# Patient Record
Sex: Female | Born: 1950 | Race: Black or African American | Hispanic: No | Marital: Married | Smoking: Never smoker
Health system: Southern US, Community
[De-identification: ages and names within clinical notes are randomized; demographics above are authoritative.]

## PROBLEM LIST (undated history)

## (undated) DIAGNOSIS — I10 Essential (primary) hypertension: Secondary | ICD-10-CM

## (undated) DIAGNOSIS — Z923 Personal history of irradiation: Secondary | ICD-10-CM

## (undated) DIAGNOSIS — C50919 Malignant neoplasm of unspecified site of unspecified female breast: Secondary | ICD-10-CM

## (undated) DIAGNOSIS — Z5189 Encounter for other specified aftercare: Secondary | ICD-10-CM

## (undated) DIAGNOSIS — F419 Anxiety disorder, unspecified: Secondary | ICD-10-CM

## (undated) HISTORY — DX: Encounter for other specified aftercare: Z51.89

## (undated) HISTORY — PX: TUBAL LIGATION: SHX77

## (undated) HISTORY — PX: BREAST LUMPECTOMY: SHX2

## (undated) HISTORY — DX: Malignant neoplasm of unspecified site of unspecified female breast: C50.919

## (undated) HISTORY — DX: Essential (primary) hypertension: I10

## (undated) HISTORY — PX: ABDOMINAL HYSTERECTOMY: SHX81

---

## 1998-01-12 ENCOUNTER — Other Ambulatory Visit: Admission: RE | Admit: 1998-01-12 | Discharge: 1998-01-12 | Payer: Self-pay | Admitting: Obstetrics & Gynecology

## 1998-01-19 ENCOUNTER — Ambulatory Visit (HOSPITAL_COMMUNITY): Admission: RE | Admit: 1998-01-19 | Discharge: 1998-01-19 | Payer: Self-pay | Admitting: Internal Medicine

## 1998-04-27 ENCOUNTER — Inpatient Hospital Stay (HOSPITAL_COMMUNITY): Admission: RE | Admit: 1998-04-27 | Discharge: 1998-04-30 | Payer: Self-pay | Admitting: Obstetrics & Gynecology

## 1999-01-17 ENCOUNTER — Other Ambulatory Visit: Admission: RE | Admit: 1999-01-17 | Discharge: 1999-01-17 | Payer: Self-pay | Admitting: Obstetrics & Gynecology

## 1999-01-18 ENCOUNTER — Ambulatory Visit (HOSPITAL_COMMUNITY): Admission: RE | Admit: 1999-01-18 | Discharge: 1999-01-18 | Payer: Self-pay | Admitting: Obstetrics & Gynecology

## 1999-01-18 ENCOUNTER — Encounter: Payer: Self-pay | Admitting: Obstetrics & Gynecology

## 2000-02-09 ENCOUNTER — Ambulatory Visit (HOSPITAL_COMMUNITY): Admission: RE | Admit: 2000-02-09 | Discharge: 2000-02-09 | Payer: Self-pay | Admitting: Obstetrics & Gynecology

## 2000-02-09 ENCOUNTER — Encounter: Payer: Self-pay | Admitting: Obstetrics & Gynecology

## 2000-03-19 ENCOUNTER — Other Ambulatory Visit: Admission: RE | Admit: 2000-03-19 | Discharge: 2000-03-19 | Payer: Self-pay | Admitting: Obstetrics & Gynecology

## 2001-02-28 ENCOUNTER — Encounter: Payer: Self-pay | Admitting: Obstetrics & Gynecology

## 2001-02-28 ENCOUNTER — Ambulatory Visit (HOSPITAL_COMMUNITY): Admission: RE | Admit: 2001-02-28 | Discharge: 2001-02-28 | Payer: Self-pay | Admitting: Obstetrics & Gynecology

## 2001-04-02 ENCOUNTER — Other Ambulatory Visit: Admission: RE | Admit: 2001-04-02 | Discharge: 2001-04-02 | Payer: Self-pay | Admitting: Obstetrics & Gynecology

## 2001-07-11 ENCOUNTER — Ambulatory Visit (HOSPITAL_COMMUNITY): Admission: RE | Admit: 2001-07-11 | Discharge: 2001-07-11 | Payer: Self-pay | Admitting: Gastroenterology

## 2002-05-07 ENCOUNTER — Other Ambulatory Visit: Admission: RE | Admit: 2002-05-07 | Discharge: 2002-05-07 | Payer: Self-pay | Admitting: Obstetrics & Gynecology

## 2003-06-18 ENCOUNTER — Other Ambulatory Visit: Admission: RE | Admit: 2003-06-18 | Discharge: 2003-06-18 | Payer: Self-pay | Admitting: Obstetrics & Gynecology

## 2004-10-19 ENCOUNTER — Other Ambulatory Visit: Admission: RE | Admit: 2004-10-19 | Discharge: 2004-10-19 | Payer: Self-pay | Admitting: Obstetrics & Gynecology

## 2005-11-23 ENCOUNTER — Ambulatory Visit (HOSPITAL_COMMUNITY): Admission: RE | Admit: 2005-11-23 | Discharge: 2005-11-23 | Payer: Self-pay | Admitting: Obstetrics & Gynecology

## 2006-12-31 ENCOUNTER — Ambulatory Visit (HOSPITAL_COMMUNITY): Admission: RE | Admit: 2006-12-31 | Discharge: 2006-12-31 | Payer: Self-pay | Admitting: Obstetrics & Gynecology

## 2007-07-17 ENCOUNTER — Ambulatory Visit (HOSPITAL_COMMUNITY): Admission: RE | Admit: 2007-07-17 | Discharge: 2007-07-17 | Payer: Self-pay | Admitting: Internal Medicine

## 2008-01-15 ENCOUNTER — Ambulatory Visit (HOSPITAL_COMMUNITY): Admission: RE | Admit: 2008-01-15 | Discharge: 2008-01-15 | Payer: Self-pay | Admitting: Obstetrics & Gynecology

## 2008-01-28 ENCOUNTER — Encounter: Admission: RE | Admit: 2008-01-28 | Discharge: 2008-01-28 | Payer: Self-pay | Admitting: Obstetrics & Gynecology

## 2009-01-26 ENCOUNTER — Ambulatory Visit (HOSPITAL_COMMUNITY): Admission: RE | Admit: 2009-01-26 | Discharge: 2009-01-26 | Payer: Self-pay | Admitting: Obstetrics & Gynecology

## 2009-02-08 ENCOUNTER — Encounter: Admission: RE | Admit: 2009-02-08 | Discharge: 2009-02-08 | Payer: Self-pay | Admitting: Obstetrics & Gynecology

## 2010-01-03 ENCOUNTER — Encounter: Admission: RE | Admit: 2010-01-03 | Discharge: 2010-01-03 | Payer: Self-pay | Admitting: Internal Medicine

## 2010-02-09 ENCOUNTER — Encounter
Admission: RE | Admit: 2010-02-09 | Discharge: 2010-02-09 | Payer: Self-pay | Source: Home / Self Care | Attending: Obstetrics & Gynecology | Admitting: Obstetrics & Gynecology

## 2010-02-20 ENCOUNTER — Encounter: Payer: Self-pay | Admitting: Internal Medicine

## 2010-02-20 ENCOUNTER — Encounter: Payer: Self-pay | Admitting: Obstetrics & Gynecology

## 2010-06-17 NOTE — Procedures (Signed)
[place]  Patient:    Tanya Turner, Tanya Turner Visit Number: [id number] MRN: [id number]          Service Type: END Location: ENDO Attending Physician:  Charna Elizabeth Dictated by:   Anselmo Rod, M.D. Proc. Date: 07/11/01 Admit Date:  07/11/2001 Discharge Date: 07/11/2001   CC:         Lilly Cove, M.D.   Procedure Report  DATE OF BIRTH:  [birth year].  PROCEDURE:  Screening colonoscopy.  ENDOSCOPIST:  Anselmo Rod, M.D.  INSTRUMENT USED:  Olympus video colonoscope.  INDICATION FOR PROCEDURE:  A 60 year old African-American female undergoing screening colonoscopy to rule out colonic polyps, masses, hemorrhoids, etc.  PREPROCEDURE PREPARATION:  Informed consent was procured from the patient. The patient was fasted for eight hours prior to the procedure and prepped with a bottle of magnesium citrate and a gallon of NuLytely the night prior to the procedure.  PREPROCEDURE PHYSICAL:  VITAL SIGNS:  The patient had stable vital signs.  NECK:  Supple.  CHEST:  Clear to auscultation.  S1, S2 regular.  ABDOMEN:  Soft with normal bowel sounds.  DESCRIPTION OF PROCEDURE:  The patient was placed in the left lateral decubitus position and sedated with 80 mg of Demerol and 8 mg of Versed intravenously.  Once the patient was adequately sedate and maintained on low-flow oxygen and continuous cardiac monitoring, the Olympus video colonoscope was advanced from the rectum to the cecum and terminal ileum without difficulty.  No masses, polyps, erosions, ulcerations, or diverticula were seen.  The patient tolerated the procedure well without complications.  IMPRESSION:  Normal colonoscopy up to the terminal ileum.  RECOMMENDATIONS: 1. Repeat colorectal cancer screening is recommended in the next 10 years    unless the patient develops any abnormal symptoms in the interim. 2. A high-fiber diet has been encouraged. Dictated by:   Anselmo Rod, M.D. Attending Physician:  Charna Elizabeth DD:  07/11/01 TD:  07/13/01 Job: [id number]

## 2010-10-26 ENCOUNTER — Other Ambulatory Visit: Payer: Self-pay | Admitting: Obstetrics & Gynecology

## 2010-10-26 DIAGNOSIS — Z1231 Encounter for screening mammogram for malignant neoplasm of breast: Secondary | ICD-10-CM

## 2011-02-14 ENCOUNTER — Ambulatory Visit
Admission: RE | Admit: 2011-02-14 | Discharge: 2011-02-14 | Disposition: A | Discharge status: Home / Self Care | Payer: Federal, State, Local not specified - PPO | Source: Ambulatory Visit | Attending: Obstetrics & Gynecology | Admitting: Obstetrics & Gynecology

## 2011-02-14 DIAGNOSIS — Z1231 Encounter for screening mammogram for malignant neoplasm of breast: Secondary | ICD-10-CM

## 2012-01-09 ENCOUNTER — Other Ambulatory Visit: Payer: Self-pay | Admitting: Obstetrics & Gynecology

## 2012-01-09 DIAGNOSIS — Z1231 Encounter for screening mammogram for malignant neoplasm of breast: Secondary | ICD-10-CM

## 2012-02-15 ENCOUNTER — Ambulatory Visit
Admission: RE | Admit: 2012-02-15 | Discharge: 2012-02-15 | Disposition: A | Discharge status: Home / Self Care | Payer: Federal, State, Local not specified - PPO | Source: Ambulatory Visit | Attending: Obstetrics & Gynecology | Admitting: Obstetrics & Gynecology

## 2012-02-15 DIAGNOSIS — Z1231 Encounter for screening mammogram for malignant neoplasm of breast: Secondary | ICD-10-CM

## 2013-01-20 ENCOUNTER — Other Ambulatory Visit: Payer: Self-pay

## 2013-01-20 DIAGNOSIS — Z1231 Encounter for screening mammogram for malignant neoplasm of breast: Secondary | ICD-10-CM

## 2013-02-20 ENCOUNTER — Ambulatory Visit
Admission: RE | Admit: 2013-02-20 | Discharge: 2013-02-20 | Disposition: A | Discharge status: Home / Self Care | Payer: Federal, State, Local not specified - PPO | Source: Ambulatory Visit

## 2013-02-20 DIAGNOSIS — Z1231 Encounter for screening mammogram for malignant neoplasm of breast: Secondary | ICD-10-CM

## 2013-12-31 ENCOUNTER — Other Ambulatory Visit: Payer: Self-pay

## 2013-12-31 DIAGNOSIS — Z1231 Encounter for screening mammogram for malignant neoplasm of breast: Secondary | ICD-10-CM

## 2014-02-23 ENCOUNTER — Ambulatory Visit: Payer: Federal, State, Local not specified - PPO

## 2014-03-02 ENCOUNTER — Ambulatory Visit
Admission: RE | Admit: 2014-03-02 | Discharge: 2014-03-02 | Disposition: A | Discharge status: Home / Self Care | Payer: Federal, State, Local not specified - PPO | Source: Ambulatory Visit

## 2014-03-02 DIAGNOSIS — Z1231 Encounter for screening mammogram for malignant neoplasm of breast: Secondary | ICD-10-CM

## 2015-03-08 ENCOUNTER — Other Ambulatory Visit: Payer: Self-pay

## 2015-03-08 DIAGNOSIS — Z1231 Encounter for screening mammogram for malignant neoplasm of breast: Secondary | ICD-10-CM

## 2015-04-14 ENCOUNTER — Ambulatory Visit
Admission: RE | Admit: 2015-04-14 | Discharge: 2015-04-14 | Disposition: A | Discharge status: Home / Self Care | Payer: Medicare Other | Source: Ambulatory Visit

## 2015-04-14 DIAGNOSIS — Z1231 Encounter for screening mammogram for malignant neoplasm of breast: Secondary | ICD-10-CM

## 2016-03-23 ENCOUNTER — Other Ambulatory Visit: Payer: Self-pay | Admitting: Obstetrics & Gynecology

## 2016-03-23 DIAGNOSIS — Z1231 Encounter for screening mammogram for malignant neoplasm of breast: Secondary | ICD-10-CM

## 2016-04-17 ENCOUNTER — Ambulatory Visit
Admission: RE | Admit: 2016-04-17 | Discharge: 2016-04-17 | Disposition: A | Discharge status: Home / Self Care | Payer: Medicare Other | Source: Ambulatory Visit | Attending: Obstetrics & Gynecology | Admitting: Obstetrics & Gynecology

## 2016-04-17 DIAGNOSIS — Z1231 Encounter for screening mammogram for malignant neoplasm of breast: Secondary | ICD-10-CM

## 2016-12-26 ENCOUNTER — Other Ambulatory Visit: Payer: Self-pay | Admitting: Internal Medicine

## 2016-12-26 ENCOUNTER — Ambulatory Visit
Admission: RE | Admit: 2016-12-26 | Discharge: 2016-12-26 | Disposition: A | Discharge status: Home / Self Care | Payer: Medicare Other | Source: Ambulatory Visit | Attending: Internal Medicine | Admitting: Internal Medicine

## 2016-12-26 DIAGNOSIS — R52 Pain, unspecified: Secondary | ICD-10-CM

## 2017-03-09 ENCOUNTER — Other Ambulatory Visit: Payer: Self-pay | Admitting: Obstetrics & Gynecology

## 2017-03-09 DIAGNOSIS — Z1231 Encounter for screening mammogram for malignant neoplasm of breast: Secondary | ICD-10-CM

## 2017-04-18 ENCOUNTER — Ambulatory Visit
Admission: RE | Admit: 2017-04-18 | Discharge: 2017-04-18 | Disposition: A | Discharge status: Home / Self Care | Payer: Medicare Other | Source: Ambulatory Visit | Attending: Obstetrics & Gynecology | Admitting: Obstetrics & Gynecology

## 2017-04-18 ENCOUNTER — Ambulatory Visit: Payer: Medicare Other

## 2017-04-18 DIAGNOSIS — Z1231 Encounter for screening mammogram for malignant neoplasm of breast: Secondary | ICD-10-CM

## 2017-06-05 ENCOUNTER — Encounter (INDEPENDENT_AMBULATORY_CARE_PROVIDER_SITE_OTHER): Payer: Self-pay | Admitting: Orthopedic Surgery

## 2017-06-05 ENCOUNTER — Ambulatory Visit (INDEPENDENT_AMBULATORY_CARE_PROVIDER_SITE_OTHER): Payer: Medicare Other

## 2017-06-05 ENCOUNTER — Ambulatory Visit (INDEPENDENT_AMBULATORY_CARE_PROVIDER_SITE_OTHER): Payer: Medicare Other | Admitting: Orthopedic Surgery

## 2017-06-05 VITALS — Ht 62.0 in | Wt 195.0 lb

## 2017-06-05 DIAGNOSIS — S838X2A Sprain of other specified parts of left knee, initial encounter: Secondary | ICD-10-CM

## 2017-06-05 DIAGNOSIS — G8929 Other chronic pain: Secondary | ICD-10-CM

## 2017-06-05 DIAGNOSIS — M25562 Pain in left knee: Secondary | ICD-10-CM

## 2017-06-05 MED ORDER — DIAZEPAM 5 MG PO TABS: ORAL_TABLET | ORAL | 0 refills | Status: DC

## 2017-06-05 NOTE — Progress Notes (Signed)
Office Visit Note   Patient: Tanya Turner           Date of Birth: 1950/05/29           MRN: [id number] Visit Date: 06/05/2017 Requested by: No referring provider defined for this encounter. PCP: Patient, No Pcp Per  Subjective: Chief Complaint  Patient presents with  . Left Knee - Pain    HPI: Tanya Turner is a patient with left knee pain.  She had a twisting injury going down the stairs over 4 weeks ago.  She has had pain and swelling since that time.  She reports medial sided pain with new mechanical symptoms.  She is retired.  Stairs are difficult going up and down at home.  She does describe occasional knifelike sharp pain on the medial aspect of the knee.  She takes Tylenol for her symptoms.              ROS: All systems reviewed are negative as they relate to the chief complaint within the history of present illness.  Patient denies  fevers or chills.   Assessment & Plan: Visit Diagnoses:  1. Chronic pain of left knee   2. Injury of meniscus of left knee, initial encounter     Plan: Impression is left knee effusion with medial joint line tenderness and fairly normal radiographs and a patient had a twisting injury to her left knee.  Statistically speaking this is highly likely to be a medial meniscal tear.  She has failed conservative measures.  Plan MRI left knee to evaluate for medial meniscal tear.  I will see her back after that study.  May need surgical intervention.  Orthopedic exam demonstrates full active and passive range of motion of both knees with mild effusion in the left knee.  Collateral cruciate ligaments are stable extensor mechanism is intact.  Medial joint line tenderness is present on the left-hand side.  Follow-Up Instructions: Return for after MRI.   Orders:  Orders Placed This Encounter  Procedures  . XR Knee 1-2 Views Left  . MR Knee Left w/o contrast   Meds ordered this encounter  Medications  . diazepam (VALIUM) 5 MG tablet    Sig: 1-2 po 30 min  prior to procedure.    Dispense:  2 tablet    Refill:  0      Procedures: No procedures performed   Clinical Data: No additional findings.  Objective: Vital Signs: Ht 5\' 2"  (1.575 m)   Wt 195 lb (88.5 kg)   BMI 35.67 kg/m   Physical Exam:   Constitutional: Patient appears well-developed HEENT:  Head: Normocephalic Eyes:EOM are normal Neck: Normal range of motion Cardiovascular: Normal rate Pulmonary/chest: Effort normal Neurologic: Patient is alert Skin: Skin is warm Psychiatric: Patient has normal mood and affect    Ortho Exam: McMurray compression testing positive on the medial side.  ACL PCL feel stable on the left.  Patient has normal gait and alignment.  Patient has full range of motion in the left knee.  Extensor mechanism is intact and nontender.  Collaterals are stable.  Medial joint line tenderness is present.  Pedal pulses palpable.  No groin pain with internal/external rotation of the leg.  Specialty Comments:  No specialty comments available.  Imaging: Xr Knee 1-2 Views Left  Result Date: 06/05/2017 AP lateral left knee reviewed.  Minimal degenerative change noted in the medial compartment.  No fracture or dislocation present in the left knee.  Patellar relation to the femur  normal.    PMFS History: There are no active problems to display for this patient.  History reviewed. No pertinent past medical history.  History reviewed. No pertinent family history.  History reviewed. No pertinent surgical history. Social History   Occupational History  . Not on file  Tobacco Use  . Smoking status: Unknown If Ever Smoked  . Smokeless tobacco: Never Used  Substance and Sexual Activity  . Alcohol use: Not on file  . Drug use: Not on file  . Sexual activity: Not on file

## 2017-06-19 ENCOUNTER — Ambulatory Visit
Admission: RE | Admit: 2017-06-19 | Discharge: 2017-06-19 | Disposition: A | Discharge status: Home / Self Care | Payer: Medicare Other | Source: Ambulatory Visit | Attending: Orthopedic Surgery | Admitting: Orthopedic Surgery

## 2017-06-19 DIAGNOSIS — M25562 Pain in left knee: Principal | ICD-10-CM

## 2017-06-19 DIAGNOSIS — G8929 Other chronic pain: Secondary | ICD-10-CM

## 2017-06-27 ENCOUNTER — Telehealth (INDEPENDENT_AMBULATORY_CARE_PROVIDER_SITE_OTHER): Payer: Self-pay

## 2017-06-27 ENCOUNTER — Ambulatory Visit (INDEPENDENT_AMBULATORY_CARE_PROVIDER_SITE_OTHER): Payer: Medicare Other | Admitting: Orthopedic Surgery

## 2017-06-27 ENCOUNTER — Encounter (INDEPENDENT_AMBULATORY_CARE_PROVIDER_SITE_OTHER): Payer: Self-pay | Admitting: Orthopedic Surgery

## 2017-06-27 DIAGNOSIS — M1712 Unilateral primary osteoarthritis, left knee: Secondary | ICD-10-CM

## 2017-06-27 NOTE — Telephone Encounter (Signed)
Submitted application online for SynviscOne injection, left knee.  

## 2017-07-01 ENCOUNTER — Encounter (INDEPENDENT_AMBULATORY_CARE_PROVIDER_SITE_OTHER): Payer: Self-pay | Admitting: Orthopedic Surgery

## 2017-07-01 NOTE — Progress Notes (Signed)
   Office Visit Note   Patient: Tanya Turner           Date of Birth: 01/31/1950           MRN: [id number] Visit Date: 06/27/2017 Requested by: No referring provider defined for this encounter. PCP: Patient, No Pcp Per  Subjective: Chief Complaint  Patient presents with  . Left Knee - Follow-up    HPI: Scheryl is a patient with left knee pain.  Since I seen her she has had an MRI scan which is reviewed.  It shows no meniscal tear but there is medial compartment arthritis.  Patient reports bad pain at night.  Hard for her to get comfortable to sleep.  She is able to walk.  She had an injection which helped her for 1 day which was placed 3 or 4 weeks ago.  Tylenol is not helpful.  She wants to avoid surgery if possible.              ROS: All systems reviewed are negative as they relate to the chief complaint within the history of present illness.  Patient denies  fevers or chills.   Assessment & Plan: Visit Diagnoses:  1. Unilateral primary osteoarthritis, left knee     Plan: Impression is left knee pain with medial compartment arthritis.  Plan is left knee gel injection.  We will preapproved that and likely get her injected within the month.  Follow-Up Instructions: Return in about 1 month (around 07/25/2017).   Orders:  No orders of the defined types were placed in this encounter.  No orders of the defined types were placed in this encounter.     Procedures: No procedures performed   Clinical Data: No additional findings.  Objective: Vital Signs: There were no vitals taken for this visit.  Physical Exam:   Constitutional: Patient appears well-developed HEENT:  Head: Normocephalic Eyes:EOM are normal Neck: Normal range of motion Cardiovascular: Normal rate Pulmonary/chest: Effort normal Neurologic: Patient is alert Skin: Skin is warm Psychiatric: Patient has normal mood and affect    Ortho Exam: Orthopedic exam demonstrates full active and passive range of  motion of that left knee with medial joint line tenderness stable collateral crucial ligaments intact extensor mechanism no other masses lymph adenopathy or skin changes noted in the left knee region.  Specialty Comments:  No specialty comments available.  Imaging: No results found.   PMFS History: There are no active problems to display for this patient.  History reviewed. No pertinent past medical history.  History reviewed. No pertinent family history.  History reviewed. No pertinent surgical history. Social History   Occupational History  . Not on file  Tobacco Use  . Smoking status: Unknown If Ever Smoked  . Smokeless tobacco: Never Used  Substance and Sexual Activity  . Alcohol use: Not on file  . Drug use: Not on file  . Sexual activity: Not on file

## 2017-07-10 ENCOUNTER — Telehealth (INDEPENDENT_AMBULATORY_CARE_PROVIDER_SITE_OTHER): Payer: Self-pay

## 2017-07-10 NOTE — Telephone Encounter (Signed)
Talked with patient and advised her that she is approved for SynviscOne injection, left knee. Covered at 100% through [facility] after Medicare. Buy& Bill No PA required No Co-pay Appt. Scheduled 07/13/17-Dr. Marlou Sa

## 2017-07-10 NOTE — Telephone Encounter (Signed)
Patient called to check on approval of synvisc injections. Please call once ready to schedule # [phone]

## 2017-07-10 NOTE — Telephone Encounter (Signed)
Talked with patient concerning approval.  Appt.scheduled.

## 2017-07-13 ENCOUNTER — Ambulatory Visit (INDEPENDENT_AMBULATORY_CARE_PROVIDER_SITE_OTHER): Payer: Medicare Other | Admitting: Orthopedic Surgery

## 2017-07-13 ENCOUNTER — Encounter (INDEPENDENT_AMBULATORY_CARE_PROVIDER_SITE_OTHER): Payer: Self-pay | Admitting: Orthopedic Surgery

## 2017-07-13 DIAGNOSIS — M1712 Unilateral primary osteoarthritis, left knee: Secondary | ICD-10-CM

## 2017-07-14 ENCOUNTER — Encounter (INDEPENDENT_AMBULATORY_CARE_PROVIDER_SITE_OTHER): Payer: Self-pay | Admitting: Orthopedic Surgery

## 2017-07-14 DIAGNOSIS — M1712 Unilateral primary osteoarthritis, left knee: Secondary | ICD-10-CM | POA: Diagnosis not present

## 2017-07-14 MED ORDER — HYLAN G-F 20 48 MG/6ML IX SOSY
48.0000 mg | PREFILLED_SYRINGE | INTRA_ARTICULAR | Status: AC | PRN
  Administered 2017-07-14: 48 mg via INTRA_ARTICULAR

## 2017-07-14 MED ORDER — LIDOCAINE HCL 1 % IJ SOLN
5.0000 mL | INTRAMUSCULAR | Status: AC | PRN
  Administered 2017-07-14: 5 mL

## 2017-07-14 NOTE — Progress Notes (Signed)
   Procedure Note  Patient: JOLEE CRITCHER             Date of Birth: 17-Mar-1950           MRN: [id number]             Visit Date: 07/13/2017  Procedures: Visit Diagnoses: Unilateral primary osteoarthritis, left knee  Large Joint Inj: L knee on 07/14/2017 8:13 AM Indications: pain, joint swelling and diagnostic evaluation Details: 18 G 1.5 in needle, superolateral approach  Arthrogram: No  Medications: 5 mL lidocaine 1 %; 48 mg Hylan 48 MG/6ML Outcome: tolerated well, no immediate complications Procedure, treatment alternatives, risks and benefits explained, specific risks discussed. Consent was given by the patient. Immediately prior to procedure a time out was called to verify the correct patient, procedure, equipment, support staff and site/side marked as required. Patient was prepped and draped in the usual sterile fashion.

## 2018-03-20 ENCOUNTER — Other Ambulatory Visit: Payer: Self-pay | Admitting: Obstetrics & Gynecology

## 2018-03-20 DIAGNOSIS — Z1231 Encounter for screening mammogram for malignant neoplasm of breast: Secondary | ICD-10-CM

## 2018-04-22 ENCOUNTER — Ambulatory Visit: Payer: Medicare Other

## 2018-05-30 ENCOUNTER — Ambulatory Visit: Payer: Medicare Other

## 2018-07-09 ENCOUNTER — Ambulatory Visit: Payer: Medicare Other

## 2018-07-12 ENCOUNTER — Ambulatory Visit: Payer: Medicare Other

## 2018-09-03 ENCOUNTER — Other Ambulatory Visit: Payer: Self-pay

## 2018-09-03 ENCOUNTER — Ambulatory Visit
Admission: RE | Admit: 2018-09-03 | Discharge: 2018-09-03 | Disposition: A | Discharge status: Home / Self Care | Payer: Medicare Other | Source: Ambulatory Visit | Attending: Obstetrics & Gynecology | Admitting: Obstetrics & Gynecology

## 2018-09-03 DIAGNOSIS — Z1231 Encounter for screening mammogram for malignant neoplasm of breast: Secondary | ICD-10-CM

## 2019-03-20 ENCOUNTER — Ambulatory Visit: Payer: Medicare Other

## 2019-03-24 ENCOUNTER — Ambulatory Visit: Payer: Medicare Other | Attending: Internal Medicine

## 2019-03-24 DIAGNOSIS — Z23 Encounter for immunization: Secondary | ICD-10-CM | POA: Insufficient documentation

## 2019-03-24 NOTE — Progress Notes (Signed)
   Covid-19 Vaccination Clinic  Name:  Tanya Turner    MRN: [id number] DOB: 02-Apr-1950  03/24/2019  Ms. Tanya Turner was observed post Covid-19 immunization for 15 minutes without incidence. She was provided with Vaccine Information Sheet and instruction to access the V-Safe system.   [name] was instructed to call 911 with any severe reactions post vaccine: Marland Kitchen Difficulty breathing  . Swelling of your face and throat  . A fast heartbeat  . A bad rash all over your body  . Dizziness and weakness    Immunizations Administered    Name Date Dose VIS Date Route   Moderna COVID-19 Vaccine 03/24/2019 10:07 AM 0.5 mL 12/31/2018 Intramuscular   Manufacturer: Moderna   Lot: [id number]   [place]:[id number]

## 2019-04-22 ENCOUNTER — Ambulatory Visit: Payer: Medicare Other | Attending: Family

## 2019-04-22 DIAGNOSIS — Z23 Encounter for immunization: Secondary | ICD-10-CM

## 2019-04-22 NOTE — Progress Notes (Signed)
   Covid-19 Vaccination Clinic  Name:  Tanya Turner    MRN: [id number] DOB: October 19, 1950  04/22/2019  Tanya Turner was observed post Covid-19 immunization for 15 minutes without incident. She was provided with Vaccine Information Sheet and instruction to access the V-Safe system.   Tanya Turner was instructed to call 911 with any severe reactions post vaccine: Marland Kitchen Difficulty breathing  . Swelling of face and throat  . A fast heartbeat  . A bad rash all over body  . Dizziness and weakness   Immunizations Administered    Name Date Dose VIS Date Route   Moderna COVID-19 Vaccine 04/22/2019 11:45 AM 0.5 mL 12/31/2018 Intramuscular   Manufacturer: Moderna   LotMV:[id number]   [id number]

## 2019-09-30 ENCOUNTER — Other Ambulatory Visit: Payer: Self-pay | Admitting: Obstetrics & Gynecology

## 2019-09-30 DIAGNOSIS — Z1231 Encounter for screening mammogram for malignant neoplasm of breast: Secondary | ICD-10-CM

## 2019-10-14 ENCOUNTER — Other Ambulatory Visit: Payer: Self-pay

## 2019-10-14 ENCOUNTER — Ambulatory Visit
Admission: RE | Admit: 2019-10-14 | Discharge: 2019-10-14 | Disposition: A | Discharge status: Home / Self Care | Payer: Medicare Other | Source: Ambulatory Visit

## 2019-10-14 DIAGNOSIS — Z1231 Encounter for screening mammogram for malignant neoplasm of breast: Secondary | ICD-10-CM

## 2019-12-02 ENCOUNTER — Ambulatory Visit: Payer: Medicare Other | Attending: Internal Medicine

## 2019-12-02 DIAGNOSIS — Z23 Encounter for immunization: Secondary | ICD-10-CM

## 2019-12-02 NOTE — Progress Notes (Signed)
   Covid-19 Vaccination Clinic  Name:  Tanya Turner    MRN: [id number] DOB: 1950-09-12  12/02/2019  Tanya Turner was observed post Covid-19 immunization for 15 minutes without incident. She was provided with Vaccine Information Sheet and instruction to access the V-Safe system.   Tanya Turner was instructed to call 911 with any severe reactions post vaccine: Marland Kitchen Difficulty breathing  . Swelling of face and throat  . A fast heartbeat  . A bad rash all over body  . Dizziness and weakness

## 2020-09-09 ENCOUNTER — Other Ambulatory Visit: Payer: Self-pay | Admitting: Internal Medicine

## 2020-09-09 DIAGNOSIS — Z1231 Encounter for screening mammogram for malignant neoplasm of breast: Secondary | ICD-10-CM

## 2020-11-02 ENCOUNTER — Ambulatory Visit
Admission: RE | Admit: 2020-11-02 | Discharge: 2020-11-02 | Disposition: A | Discharge status: Home / Self Care | Payer: Medicare Other | Source: Ambulatory Visit | Attending: Internal Medicine | Admitting: Internal Medicine

## 2020-11-02 ENCOUNTER — Other Ambulatory Visit: Payer: Self-pay

## 2020-11-02 DIAGNOSIS — Z1231 Encounter for screening mammogram for malignant neoplasm of breast: Secondary | ICD-10-CM

## 2020-11-08 ENCOUNTER — Other Ambulatory Visit: Payer: Self-pay | Admitting: Internal Medicine

## 2020-11-08 DIAGNOSIS — R928 Other abnormal and inconclusive findings on diagnostic imaging of breast: Secondary | ICD-10-CM

## 2020-11-24 ENCOUNTER — Other Ambulatory Visit: Payer: Self-pay | Admitting: Internal Medicine

## 2020-11-24 ENCOUNTER — Ambulatory Visit
Admission: RE | Admit: 2020-11-24 | Discharge: 2020-11-24 | Disposition: A | Discharge status: Home / Self Care | Payer: Medicare Other | Source: Ambulatory Visit | Attending: Internal Medicine | Admitting: Internal Medicine

## 2020-11-24 ENCOUNTER — Other Ambulatory Visit: Payer: Self-pay

## 2020-11-24 DIAGNOSIS — R921 Mammographic calcification found on diagnostic imaging of breast: Secondary | ICD-10-CM

## 2020-11-24 DIAGNOSIS — R928 Other abnormal and inconclusive findings on diagnostic imaging of breast: Secondary | ICD-10-CM

## 2020-12-02 ENCOUNTER — Ambulatory Visit
Admission: RE | Admit: 2020-12-02 | Discharge: 2020-12-02 | Disposition: A | Discharge status: Home / Self Care | Payer: Medicare Other | Source: Ambulatory Visit | Attending: Internal Medicine | Admitting: Internal Medicine

## 2020-12-02 ENCOUNTER — Other Ambulatory Visit: Payer: Self-pay

## 2020-12-02 DIAGNOSIS — R921 Mammographic calcification found on diagnostic imaging of breast: Secondary | ICD-10-CM

## 2020-12-03 ENCOUNTER — Telehealth: Payer: Self-pay | Admitting: Hematology

## 2020-12-03 NOTE — Telephone Encounter (Signed)
Spoke with the patient to confirm morning clinic appointment for 11/9, packet mailed to patient

## 2020-12-06 ENCOUNTER — Encounter: Payer: Self-pay | Admitting: *Deleted

## 2020-12-06 DIAGNOSIS — D0511 Intraductal carcinoma in situ of right breast: Secondary | ICD-10-CM

## 2020-12-07 ENCOUNTER — Encounter: Payer: Self-pay | Admitting: *Deleted

## 2020-12-07 NOTE — Progress Notes (Signed)
Radiation Oncology         ([phone]) [phone] ________________________________  Initial Outpatient Consultation  Name: Tanya Turner MRN: [id number]  Date: 12/08/2020  DOB: 04-25-1950  CC:Tanya Panda, MD  Tanya Luna, MD   REFERRING PHYSICIAN: Erroll Luna, MD  DIAGNOSIS:    ICD-10-CM   1. Ductal carcinoma in situ (DCIS) of right breast  D05.11      Stage 0 (cTis (DCIS), cN0, cM0) Right Breast Intermediate grade DCIS, ER- / PR-   CHIEF COMPLAINT: Here to discuss management of right breast DCIS  HISTORY OF PRESENT ILLNESS::Tanya Turner is a 70 y.o. female who presented with breast right abnormality on the following imaging: bilateral screening mammogram on the date of 11/02/20.  No symptoms, if any, were reported at that time. Diagnostic right breast mammogram on 11/24/20 further revealed three separate 0.3 cm groups of indeterminate calcifications within the upper right breast.     Right breast biopsies on date of 12/02/20 showed intermediate grade DCIS with calcifications in both biopsies of the superior-posterior, and upper-inner right breast.  ER status: negative; PR status negative. No lymph nodes examined.  She is in her usual state of health.  She is here with her husband today.  PREVIOUS RADIATION THERAPY: No  PAST MEDICAL HISTORY:  has a past medical history of Blood transfusion without reported diagnosis (03/31/1998), Breast cancer ([place]) (11/24/2020), and Hypertension (12/30/1997).    PAST SURGICAL HISTORY: Past Surgical History:  Procedure Laterality Date   ABDOMINAL HYSTERECTOMY  03/31/1998   TUBAL LIGATION  08/31/1984    FAMILY HISTORY: family history includes Alcohol abuse in her father; Diabetes in her brother; Hypertension in her brother; Kidney disease in her brother; Prostate cancer (age of onset: 55) in her brother; Prostate cancer (age of onset: 27) in her maternal uncle; Stroke in her brother.  SOCIAL HISTORY:  reports that she has never smoked. She  has never used smokeless tobacco. She reports current alcohol use. She reports that she does not use drugs.  ALLERGIES: Patient has no known allergies.  MEDICATIONS:  Current Outpatient Medications  Medication Sig Dispense Refill   aspirin EC 81 MG tablet Take 81 mg by mouth daily. Swallow whole.     atorvastatin (LIPITOR) 40 MG tablet Take 40 mg by mouth daily.     calcitRIOL (ROCALTROL) 0.25 MCG capsule Take 400 mcg by mouth daily. Pt takes 400mg  OTC Calcitriol Daily     carvedilol (COREG CR) 20 MG 24 hr capsule Take 20 mg by mouth daily.     Cholecalciferol (VITAMIN D3) 10 MCG (400 UNIT) CAPS Take 400 Units by mouth daily.     estradiol (ESTRACE) 2 MG tablet Take 2 mg by mouth daily.     hydrochlorothiazide (HYDRODIURIL) 25 MG tablet Take 25 mg by mouth daily.     losartan (COZAAR) 100 MG tablet Take 100 mg by mouth daily.     nisoldipine (SULAR) 17 MG 24 hr tablet Take 17 mg by mouth daily.     No current facility-administered medications for this encounter.   REVIEW OF SYSTEMS: As above in HPI.   PHYSICAL EXAM:  vitals were not taken for this visit.   General: Alert and oriented, in no acute distress HEENT: Head is normocephalic. Extraocular movements are intact. Oropharynx is clear. Neck: Neck is supple, no palpable cervical or supraclavicular lymphadenopathy. Heart: Regular in rate and rhythm with no murmurs, rubs, or gallops. Chest: Clear to auscultation bilaterally, with no rhonchi, wheezes, or rales. Lymphatics: see Neck  Exam Skin: No concerning lesions. Musculoskeletal: symmetric strength and muscle tone throughout. Neurologic: Cranial nerves II through XII are grossly intact. No obvious focalities. Speech is fluent. Coordination is intact. Psychiatric: Judgment and insight are intact. Affect is appropriate. Breasts: There is postbiopsy thickening in the upper right breast. No other palpable masses appreciated in the breasts or axillae bilaterally.  ECOG = 0  0 -  Asymptomatic (Fully active, able to carry on all predisease activities without restriction)  1 - Symptomatic but completely ambulatory (Restricted in physically strenuous activity but ambulatory and able to carry out work of a light or sedentary nature. For example, light housework, office work)  2 - Symptomatic, <50% in bed during the day (Ambulatory and capable of all self care but unable to carry out any work activities. Up and about more than 50% of waking hours)  3 - Symptomatic, >50% in bed, but not bedbound (Capable of only limited self-care, confined to bed or chair 50% or more of waking hours)  4 - Bedbound (Completely disabled. Cannot carry on any self-care. Totally confined to bed or chair)  5 - Death   Eustace Pen MM, Creech RH, Tormey DC, et al. [phone]). "Toxicity and response criteria of the [place] [employer]". Bedias Oncol. 5 (6): 649-55   LABORATORY DATA:  Lab Results  Component Value Date   WBC 10.0 12/08/2020   HGB 12.9 12/08/2020   HCT 39.8 12/08/2020   MCV 85.2 12/08/2020   PLT 318 12/08/2020   CMP     Component Value Date/Time   NA 142 12/08/2020 0824   K 3.6 12/08/2020 0824   CL 106 12/08/2020 0824   CO2 25 12/08/2020 0824   GLUCOSE 103 (H) 12/08/2020 0824   BUN 12 12/08/2020 0824   CREATININE 0.68 12/08/2020 0824   CALCIUM 9.4 12/08/2020 0824   PROT 7.9 12/08/2020 0824   ALBUMIN 3.6 12/08/2020 0824   AST 14 (L) 12/08/2020 0824   ALT 11 12/08/2020 0824   ALKPHOS 76 12/08/2020 0824   BILITOT 0.3 12/08/2020 0824   GFRNONAA >60 12/08/2020 0824         RADIOGRAPHY: MM Digital Diagnostic Unilat R  Result Date: 11/24/2020 CLINICAL DATA:  70 year old female for further evaluation of new RIGHT breast calcifications identified on screening mammogram. EXAM: DIGITAL DIAGNOSTIC UNILATERAL RIGHT MAMMOGRAM TECHNIQUE: Right digital diagnostic mammography was performed. Mammographic images were processed with CAD. COMPARISON:  Previous exam(s).  ACR Breast Density Category c: The breast tissue is heterogeneously dense, which may obscure small masses. FINDINGS: Full field and magnification views of the RIGHT breast demonstrate 3 separate 0.3 cm groups of slightly heterogeneous calcifications within the UPPER RIGHT breast, middle to posterior depth. Two of these groups of calcifications are immediately adjacent to each other spanning a total distance of 0.9 cm. The other group is located 2 cm inferomedial to the other 2 groups. The entire area spanned by all 3 groups measures 3 cm. IMPRESSION: 1. Three separate 0.3 cm groups of indeterminate calcifications within the UPPER RIGHT breast. Tissue sampling is recommended. RECOMMENDATION: Stereotactic guided RIGHT breast biopsy, 1 or 2 site at the discretion of the performing radiologist. I have discussed the findings and recommendations with the patient. If applicable, a reminder letter will be sent to the patient regarding the next appointment. BI-RADS CATEGORY  4: Suspicious. Electronically Signed   By: Margarette Canada M.D.   On: 11/24/2020 10:16  MM CLIP PLACEMENT RIGHT  Result Date: 12/02/2020 CLINICAL DATA:  Post biopsy  mammogram of the right breast for clip placement. EXAM: 3D DIAGNOSTIC RIGHT MAMMOGRAM POST STEREOTACTIC BIOPSY COMPARISON:  Previous exam(s). FINDINGS: 3D Mammographic images were obtained following stereotactic guided biopsy of 2 groups of calcifications in the superior posterior right breast. The biopsy marking clips are in expected position at the sites of biopsy. IMPRESSION: 1. Appropriate positioning of the coil shaped biopsy marking clip at the site of biopsy in the superior posterior right breast. 2. Appropriate positioning of the ribbon shaped biopsy marking clip in the upper inner posterior right breast. Final Assessment: Post Procedure Mammograms for Marker Placement Electronically Signed   By: Ammie Ferrier M.D.   On: 12/02/2020 09:35  MM RT BREAST BX W LOC DEV 1ST LESION  IMAGE BX SPEC STEREO GUIDE  Addendum Date: 12/06/2020   ADDENDUM REPORT: 12/06/2020 08:46 ADDENDUM: Pathology revealed INTERMEDIATE GRADE DUCTAL CARCINOMA IN SITU WITH CALCIFICATIONS AND NECROSIS of the RIGHT breast, superior posterior, (coil clip). This was found to be concordant by Dr. Ammie Ferrier. Pathology revealed INTERMEDIATE GRADE DUCTAL CARCINOMA IN SITU WITH CALCIFICATIONS AND NECROSIS of the RIGHT breast, upper inner, posterior, (ribbon clip). This was found to be concordant by Dr. Ammie Ferrier. Pathology results were discussed with the patient by telephone. The patient reported doing well after the biopsies with tenderness at the sites. Post biopsy instructions and care were reviewed and questions were answered. The patient was encouraged to call [facility] for any additional concerns. My direct phone number was provided. The patient was referred to [facility] on December 08, 2020. Pathology results reported by Terie Purser, RN on 12/03/2020. Electronically Signed   By: Ammie Ferrier M.D.   On: 12/06/2020 08:46   Result Date: 12/06/2020 CLINICAL DATA:  70 year old female presenting for stereotactic biopsy of 2 groups of calcifications in the right breast. EXAM: RIGHT BREAST STEREOTACTIC CORE NEEDLE BIOPSY COMPARISON:  Previous exams. FINDINGS: The patient and I discussed the procedure of stereotactic-guided biopsy including benefits and alternatives. We discussed the high likelihood of a successful procedure. We discussed the risks of the procedure including infection, bleeding, tissue injury, clip migration, and inadequate sampling. Informed written consent was given. The usual time out protocol was performed immediately prior to the procedure. Using sterile technique and 1% Lidocaine as local anesthetic, under stereotactic guidance, a 9 gauge vacuum assisted device was used to perform  core needle biopsy of calcifications in the superior posterior right breast using a superior approach. Specimen radiograph was performed showing calcifications in multiple core samples. Specimens with calcifications are identified for pathology. Lesion quadrant: Upper outer quadrant At the conclusion of the procedure, a coil shaped shaped tissue marker clip was deployed into the biopsy cavity. -------------------------------------------------------------- Using sterile technique and 1% Lidocaine as local anesthetic, under stereotactic guidance, a 9 gauge vacuum assisted device was used to perform core needle biopsy of calcifications in the upper slightly inner posterior right breast using a superior approach. Specimen radiograph was performed showing calcifications in multiple core samples. Specimens with calcifications are identified for pathology. Lesion quadrant: Upper inner quadrant At the conclusion of the procedure, a ribbon shaped shaped tissue marker clip was deployed into the biopsy cavity. Follow-up 2-view mammogram was performed and dictated separately. IMPRESSION: 1. Stereotactic-guided biopsy of calcifications in the superior posterior right breast. No apparent complications. 2. Stereotactic guided biopsy of calcifications in the upper slightly inner posterior right breast. No apparent complications. Electronically Signed: By: Ammie Ferrier  M.D. On: 12/02/2020 09:28  MM RT BREAST BX W LOC DEV EA AD LESION IMG BX SPEC STEREO GUIDE  Addendum Date: 12/06/2020   ADDENDUM REPORT: 12/06/2020 08:46 ADDENDUM: Pathology revealed INTERMEDIATE GRADE DUCTAL CARCINOMA IN SITU WITH CALCIFICATIONS AND NECROSIS of the RIGHT breast, superior posterior, (coil clip). This was found to be concordant by Dr. Ammie Ferrier. Pathology revealed INTERMEDIATE GRADE DUCTAL CARCINOMA IN SITU WITH CALCIFICATIONS AND NECROSIS of the RIGHT breast, upper inner, posterior, (ribbon clip). This was found to be concordant by Dr.  Ammie Ferrier. Pathology results were discussed with the patient by telephone. The patient reported doing well after the biopsies with tenderness at the sites. Post biopsy instructions and care were reviewed and questions were answered. The patient was encouraged to call [facility] for any additional concerns. My direct phone number was provided. The patient was referred to [facility] on December 08, 2020. Pathology results reported by Terie Purser, RN on 12/03/2020. Electronically Signed   By: Ammie Ferrier M.D.   On: 12/06/2020 08:46   Result Date: 12/06/2020 CLINICAL DATA:  70 year old female presenting for stereotactic biopsy of 2 groups of calcifications in the right breast. EXAM: RIGHT BREAST STEREOTACTIC CORE NEEDLE BIOPSY COMPARISON:  Previous exams. FINDINGS: The patient and I discussed the procedure of stereotactic-guided biopsy including benefits and alternatives. We discussed the high likelihood of a successful procedure. We discussed the risks of the procedure including infection, bleeding, tissue injury, clip migration, and inadequate sampling. Informed written consent was given. The usual time out protocol was performed immediately prior to the procedure. Using sterile technique and 1% Lidocaine as local anesthetic, under stereotactic guidance, a 9 gauge vacuum assisted device was used to perform core needle biopsy of calcifications in the superior posterior right breast using a superior approach. Specimen radiograph was performed showing calcifications in multiple core samples. Specimens with calcifications are identified for pathology. Lesion quadrant: Upper outer quadrant At the conclusion of the procedure, a coil shaped shaped tissue marker clip was deployed into the biopsy cavity. -------------------------------------------------------------- Using sterile technique and 1% Lidocaine as  local anesthetic, under stereotactic guidance, a 9 gauge vacuum assisted device was used to perform core needle biopsy of calcifications in the upper slightly inner posterior right breast using a superior approach. Specimen radiograph was performed showing calcifications in multiple core samples. Specimens with calcifications are identified for pathology. Lesion quadrant: Upper inner quadrant At the conclusion of the procedure, a ribbon shaped shaped tissue marker clip was deployed into the biopsy cavity. Follow-up 2-view mammogram was performed and dictated separately. IMPRESSION: 1. Stereotactic-guided biopsy of calcifications in the superior posterior right breast. No apparent complications. 2. Stereotactic guided biopsy of calcifications in the upper slightly inner posterior right breast. No apparent complications. Electronically Signed: By: Ammie Ferrier M.D. On: 12/02/2020 09:28     IMPRESSION/PLAN: DCIS, right breast, ER negative  It was a pleasure meeting the patient today. We discussed the risks, benefits, and side effects of radiotherapy. I recommend radiotherapy to the right breast to reduce her risk of locoregional recurrence by 2/3.  We discussed that radiation would take approximately 4 weeks to complete and that I would give the patient a few weeks to heal following surgery before starting treatment planning.  We spoke about acute effects including skin irritation and fatigue as well as much less common late effects including internal organ injury or irritation. We spoke about the latest technology  that is used to minimize the risk of late effects for patients undergoing radiotherapy to the breast or chest wall. No guarantees of treatment were given. The patient is enthusiastic about proceeding with treatment. I look forward to participating in the patient's care.  I will await her referral back to me for postoperative follow-up and eventual CT simulation/treatment planning.  On date of  service, in total, I spent 45 minutes on this encounter. Patient was seen in person.   __________________________________________   Eppie Gibson, MD  This document serves as a record of services personally performed by Eppie Gibson, MD. It was created on her behalf by Roney Mans, a trained medical scribe. The creation of this record is based on the scribe's personal observations and the provider's statements to them. This document has been checked and approved by the attending provider.

## 2020-12-08 ENCOUNTER — Ambulatory Visit: Payer: Medicare Other | Admitting: Physical Therapy

## 2020-12-08 ENCOUNTER — Encounter: Payer: Self-pay | Admitting: *Deleted

## 2020-12-08 ENCOUNTER — Encounter: Payer: Self-pay | Admitting: Hematology

## 2020-12-08 ENCOUNTER — Ambulatory Visit: Payer: Self-pay | Admitting: Surgery

## 2020-12-08 ENCOUNTER — Ambulatory Visit
Admission: RE | Admit: 2020-12-08 | Discharge: 2020-12-08 | Disposition: A | Discharge status: Home / Self Care | Payer: Medicare Other | Source: Ambulatory Visit | Attending: Radiation Oncology | Admitting: Radiation Oncology

## 2020-12-08 ENCOUNTER — Encounter: Payer: Self-pay | Admitting: Genetic Counselor

## 2020-12-08 ENCOUNTER — Inpatient Hospital Stay (HOSPITAL_BASED_OUTPATIENT_CLINIC_OR_DEPARTMENT_OTHER): Payer: Medicare Other | Admitting: Hematology

## 2020-12-08 ENCOUNTER — Inpatient Hospital Stay: Payer: Medicare Other | Attending: Hematology

## 2020-12-08 ENCOUNTER — Other Ambulatory Visit: Payer: Self-pay

## 2020-12-08 ENCOUNTER — Ambulatory Visit (HOSPITAL_BASED_OUTPATIENT_CLINIC_OR_DEPARTMENT_OTHER): Payer: Medicare Other | Admitting: Genetic Counselor

## 2020-12-08 VITALS — BP 160/60 | HR 83 | Temp 97.9°F | Resp 18 | Ht 62.0 in | Wt 190.8 lb

## 2020-12-08 DIAGNOSIS — M549 Dorsalgia, unspecified: Secondary | ICD-10-CM

## 2020-12-08 DIAGNOSIS — D0511 Intraductal carcinoma in situ of right breast: Secondary | ICD-10-CM | POA: Insufficient documentation

## 2020-12-08 DIAGNOSIS — Z7982 Long term (current) use of aspirin: Secondary | ICD-10-CM

## 2020-12-08 DIAGNOSIS — I10 Essential (primary) hypertension: Secondary | ICD-10-CM | POA: Insufficient documentation

## 2020-12-08 DIAGNOSIS — E7800 Pure hypercholesterolemia, unspecified: Secondary | ICD-10-CM

## 2020-12-08 DIAGNOSIS — Z79899 Other long term (current) drug therapy: Secondary | ICD-10-CM | POA: Insufficient documentation

## 2020-12-08 DIAGNOSIS — Z8042 Family history of malignant neoplasm of prostate: Secondary | ICD-10-CM

## 2020-12-08 LAB — CBC WITH DIFFERENTIAL (CANCER CENTER ONLY)
Abs Immature Granulocytes: 0.02 10*3/uL (ref 0.00–0.07)
Basophils Absolute: 0 10*3/uL (ref 0.0–0.1)
Basophils Relative: 0 %
Eosinophils Absolute: 0.1 10*3/uL (ref 0.0–0.5)
Eosinophils Relative: 1 %
HCT: 39.8 % (ref 36.0–46.0)
Hemoglobin: 12.9 g/dL (ref 12.0–15.0)
Immature Granulocytes: 0 %
Lymphocytes Relative: 16 %
Lymphs Abs: 1.6 10*3/uL (ref 0.7–4.0)
MCH: 27.6 pg (ref 26.0–34.0)
MCHC: 32.4 g/dL (ref 30.0–36.0)
MCV: 85.2 fL (ref 80.0–100.0)
Monocytes Absolute: 0.7 10*3/uL (ref 0.1–1.0)
Monocytes Relative: 7 %
Neutro Abs: 7.5 10*3/uL (ref 1.7–7.7)
Neutrophils Relative %: 76 %
Platelet Count: 318 10*3/uL (ref 150–400)
RBC: 4.67 MIL/uL (ref 3.87–5.11)
RDW: 14 % (ref 11.5–15.5)
WBC Count: 10 10*3/uL (ref 4.0–10.5)
nRBC: 0 % (ref 0.0–0.2)

## 2020-12-08 LAB — CMP (CANCER CENTER ONLY)
ALT: 11 U/L (ref 0–44)
AST: 14 U/L — ABNORMAL LOW (ref 15–41)
Albumin: 3.6 g/dL (ref 3.5–5.0)
Alkaline Phosphatase: 76 U/L (ref 38–126)
Anion gap: 11 (ref 5–15)
BUN: 12 mg/dL (ref 8–23)
CO2: 25 mmol/L (ref 22–32)
Calcium: 9.4 mg/dL (ref 8.9–10.3)
Chloride: 106 mmol/L (ref 98–111)
Creatinine: 0.68 mg/dL (ref 0.44–1.00)
GFR, Estimated: >60 mL/min (ref >60)
Glucose, Bld: 103 mg/dL — ABNORMAL HIGH (ref 70–99)
Potassium: 3.6 mmol/L (ref 3.5–5.1)
Sodium: 142 mmol/L (ref 135–145)
Total Bilirubin: 0.3 mg/dL (ref 0.3–1.2)
Total Protein: 7.9 g/dL (ref 6.5–8.1)

## 2020-12-08 LAB — GENETIC SCREENING ORDER

## 2020-12-08 NOTE — Progress Notes (Addendum)
REFERRING PROVIDER: Truitt Merle, MD [address] [place], [place] [address]  PRIMARY PROVIDER:  [name], MD  PRIMARY REASON FOR VISIT:  1. Ductal carcinoma in situ (DCIS) of right breast   2. Family history of prostate cancer     HISTORY OF PRESENT ILLNESS:   Tanya Turner, a 70 y.o. female, was seen for a [place] cancer genetics consultation during the breast multidisciplinary clinic at the request of Dr. Mellody Drown due to a personal and family history of cancer.  Tanya Turner presents to clinic today to discuss the possibility of a hereditary predisposition to cancer, to discuss genetic testing, and to further clarify her future cancer risks, as well as potential cancer risks for family members.   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  CANCER HISTORY:  Oncology History Overview Note  Cancer Staging Ductal carcinoma in situ (DCIS) of right breast Staging form: Breast, AJCC 8th Edition - Clinical stage from 12/02/2020: Stage 0 (cTis (DCIS), cN0, cM0, G2) - Signed by Truitt Merle, MD on 12/07/2020    Ductal carcinoma in situ (DCIS) of right breast  11/24/2020 Mammogram   EXAM: DIGITAL DIAGNOSTIC UNILATERAL RIGHT MAMMOGRAM  IMPRESSION: 1. Three separate 0.3 cm groups of indeterminate calcifications within the UPPER RIGHT breast. Tissue sampling is recommended.   12/02/2020 Cancer Staging   Staging form: Breast, AJCC 8th Edition - Clinical stage from 12/02/2020: Stage 0 (cTis (DCIS), cN0, cM0, G2) - Signed by Truitt Merle, MD on 12/07/2020 Stage prefix: Initial diagnosis Histologic grading system: 3 grade system    12/02/2020 Pathology Results   Diagnosis 1. Breast, right, needle core biopsy, superior posterior, coil clip - DUCTAL CARCINOMA IN SITU WITH CALCIFICATIONS AND NECROSIS. - SEE MICROSCOPIC DESCRIPTION. 2. Breast, right, needle core biopsy, upper inner, posterior, ribbon clip - DUCTAL  CARCINOMA IN SITU WITH CALCIFICATIONS AND NECROSIS. - SEE MICROSCOPIC DESCRIPTION Microscopic Comment 1. -2. The DCIS has intermediate nuclear grade.  1. PROGNOSTIC INDICATORS Results: Estrogen Receptor: <1%, NEGATIVE Progesterone Receptor: 0%, NEGATIVE   12/06/2020 Initial Diagnosis   Ductal carcinoma in situ (DCIS) of right breast     Past Medical History:  Diagnosis Date   Blood transfusion without reported diagnosis 03/31/1998   Breast cancer ([facility]) 11/24/2020   Hypertension 12/30/1997    Past Surgical History:  Procedure Laterality Date   ABDOMINAL HYSTERECTOMY  03/31/1998   TUBAL LIGATION  08/31/1984    Social History   Socioeconomic History   Marital status: Married    Spouse name: Not on file   Number of children: Not on file   Years of education: Not on file   Highest education level: Not on file  Occupational History   Not on file  Tobacco Use   Smoking status: Never   Smokeless tobacco: Never   Tobacco comments:    I smoked one or two cigarettes when in college.  Substance and Sexual Activity   Alcohol use: Yes    Comment: occas   Drug use: Never   Sexual activity: Yes    Birth control/protection: Post-menopausal  Other Topics Concern   Not on file  Social History Narrative   Not on file   Social Determinants of Health   Financial Resource Strain: Not on file  Food Insecurity: Not on file  Transportation Needs: Not on file  Physical Activity: Not on file  Stress: Not on file  Social Connections: Not on file  FAMILY HISTORY:  We obtained a detailed, 4-generation family history.  Significant diagnoses are listed below: Family History  Problem Relation Age of Onset   Prostate cancer Brother 39   Prostate cancer Maternal Uncle 51     Tanya Turner's brother was diagnosed with prostate cancer at age 2, he was exposed to agent orange and died due to renal failure at age 26. Her maternal half-uncle was diagnosed with prostate cancer at 24 and  died due to Alzheimer's.   Tanya Turner is unaware of previous family history of genetic testing for hereditary cancer risks. There is no reported Ashkenazi Jewish ancestry.   GENETIC COUNSELING ASSESSMENT: Tanya Turner is a 70 y.o. female with a personal and family history of cancer which is somewhat suggestive of a hereditary cancer syndrome and predisposition to cancer. We, therefore, discussed and recommended the following at today's visit.   DISCUSSION: We discussed that 5 - 10% of cancer is hereditary, with most cases of hereditary breast cancer associated with mutations in BRCA1/2.  There are other genes that can be associated with hereditary breast and prostate cancer syndromes. Type of cancer risk and level of risk are gene-specific. We discussed that testing is beneficial for several reasons including knowing how to follow individuals after completing their treatment, identifying whether potential treatment options would be beneficial, and understanding if other family members could be at risk for cancer and allowing them to undergo genetic testing.   We reviewed the characteristics, features and inheritance patterns of hereditary cancer syndromes. We also discussed genetic testing, including the appropriate family members to test, the process of testing, insurance coverage and turn-around-time for results. We discussed the implications of a negative, positive and/or variant of uncertain significant result. In order to get genetic test results in a timely manner so that Tanya Turner can use these genetic test results for surgical decisions, we recommended Tanya Turner pursue genetic testing for the BRCAplus. Once complete, we recommend Tanya Turner pursue reflex genetic testing to a more comprehensive gene panel.   Tanya Turner  was offered a common hereditary cancer panel (47 genes) and an expanded pan-cancer panel (77 genes). Tanya Turner was informed of the benefits and limitations of each panel, including that  expanded pan-cancer panels contain genes that do not have clear management guidelines at this point in time.  We also discussed that as the number of genes included on a panel increases, the chances of variants of uncertain significance increases.  After considering the benefits and limitations of each gene panel, Tanya Turner elected to have [facility] CustomNext Panel (1 genes)+RNA.  The CustomNext-Cancer+RNAinsight panel offered by [facility] includes sequencing and rearrangement analysis for the following 47 genes:  APC, ATM, AXIN2, BARD1, BMPR1A, BRCA1, BRCA2, BRIP1, CDH1, CDK4, CDKN2A, CHEK2, CTNNA1, DICER1, EPCAM, GREM1, HOXB13, KIT, MEN1, MLH1, MSH2, MSH3, MSH6, MUTYH, NBN, NF1, NTHL1, PALB2, PDGFRA, PMS2, POLD1, POLE, PTEN, RAD50, RAD51C, RAD51D, SDHA, SDHB, SDHC, SDHD, SMAD4, SMARCA4, STK11, TP53, TSC1, TSC2, and VHL.  RNA data is routinely analyzed for use in variant interpretation for all genes.  Based on Tanya Turner's personal and family history of cancer, she meets medical criteria for genetic testing. Despite that she meets criteria, she may still have an out of pocket cost. We discussed that if her out of pocket cost for testing is over $100, the laboratory should contact them to discuss self-pay prices, patient pay assistance programs, if applicable, and other billing options.   PLAN: After considering the risks, benefits, and limitations,  Tanya Turner provided informed consent to pursue genetic testing and the blood sample was sent to [facility] for analysis of the CustomNext Panel+RNA (47 genes). Results should be available within approximately 1-2 weeks' time, at which point they will be disclosed by telephone to Tanya Turner, as will any additional recommendations warranted by these results. Tanya Turner will receive a summary of her genetic counseling visit and a copy of her results once available. This information will also be available in Epic.   Tanya Turner questions were answered to her  satisfaction today. Our contact information was provided should additional questions or concerns arise. Thank you for the referral and allowing [place] to share in the care of your patient.   Lucille Passy, MS, [facility] Genetic Counselor [email or web address] (P) [phone]  The patient was seen for a total of 20 minutes in face-to-face genetic counseling.  The patient brought her husband. Drs. Magrinat, Lindi Adie and/or Burr Medico were available to discuss this case as needed.  _______________________________________________________________________ For Office Staff:  Number of people involved in session: 2 Was an Intern/ student involved with case: no

## 2020-12-08 NOTE — Progress Notes (Signed)
[place]   [phone] Fax:([phone] [place] Note   Patient Care Team: [name], MD as PCP - General (Internal Medicine) Mauro Kaufmann, RN as Oncology Nurse Navigator Rockwell Germany, RN as Oncology Nurse Navigator  Date of Service:  12/08/2020   CHIEF COMPLAINTS/PURPOSE OF CONSULTATION:  Right Breast DCIS, ER-  REFERRING PHYSICIAN:  [facility]   ASSESSMENT & PLAN:  Tanya Turner is a 70 y.o. postmenopausal female with   1. Right breast DCIS, grade 2, ER-/PR- -found on screening mammogram. Right diagnostic MM on 11/24/20 showed three groups of calcifications. Biopsy 12/02/20 confirmed DCIS. -I discussed her breast imaging and needle biopsy results with patient and her family members in great detail. -She is a candidate for breast conservation surgery. She has been seen by breast surgeon Dr. Brantley Stage, who recommends lumpectomy. -Her DCIS will be cured by complete surgical resection. Any form of adjuvant therapy is preventive. -Given her negative ER and PR, I do not recommend chemoprevention with antiestrogen therapy. -She will likely benefit from breast radiation if she undergo lumpectomy to decrease the risk of breast cancer. -We also discussed that biopsy may have sampling limitation, we will review her surgical path, to see if she has any invasive carcinoma components. -We discussed breast cancer surveillance after she completes treatment, Including annual mammogram, breast exam every 6-12 months.   PLAN:  -she will proceed with lumpectomy with Dr. Brantley Stage soon. -I will see her as needed.   Oncology History Overview Note  Cancer Staging Ductal carcinoma in situ (DCIS) of right breast Staging form: Breast, AJCC 8th Edition - Clinical stage from 12/02/2020: Stage 0 (cTis (DCIS), cN0, cM0, G2) - Signed by Truitt Merle, MD on 12/07/2020    Ductal carcinoma in situ (DCIS) of right breast  11/24/2020 Mammogram    EXAM: DIGITAL DIAGNOSTIC UNILATERAL RIGHT MAMMOGRAM  IMPRESSION: 1. Three separate 0.3 cm groups of indeterminate calcifications within the UPPER RIGHT breast. Tissue sampling is recommended.   12/02/2020 Cancer Staging   Staging form: Breast, AJCC 8th Edition - Clinical stage from 12/02/2020: Stage 0 (cTis (DCIS), cN0, cM0, G2) - Signed by Truitt Merle, MD on 12/07/2020 Stage prefix: Initial diagnosis Histologic grading system: 3 grade system    12/02/2020 Pathology Results   Diagnosis 1. Breast, right, needle core biopsy, superior posterior, coil clip - DUCTAL CARCINOMA IN SITU WITH CALCIFICATIONS AND NECROSIS. - SEE MICROSCOPIC DESCRIPTION. 2. Breast, right, needle core biopsy, upper inner, posterior, ribbon clip - DUCTAL CARCINOMA IN SITU WITH CALCIFICATIONS AND NECROSIS. - SEE MICROSCOPIC DESCRIPTION Microscopic Comment 1. -2. The DCIS has intermediate nuclear grade.  1. PROGNOSTIC INDICATORS Results: Estrogen Receptor: <1%, NEGATIVE Progesterone Receptor: 0%, NEGATIVE   12/06/2020 Initial Diagnosis   Ductal carcinoma in situ (DCIS) of right breast      HISTORY OF PRESENTING ILLNESS:  Tanya Turner 70 y.o. female is a here because of breast cancer. The [facility]. The patient presents to the clinic today accompanied by her husband.   She had routine screening mammography on 11/02/20 showing a possible abnormality in the right breast. She underwent right diagnostic mammography on 11/24/20 showing: three separate 0.3 cm groups of indeterminate calcifications in upper right breast.  Biopsy on 12/02/20 showed: DCIS with calcifications and necrosis, intermediate grade. Prognostic indicators significant for: estrogen receptor, <1% negative and progesterone receptor, 0% negative.   Today the patient notes they felt/feeling prior/after... -she reports back pain  She has a PMHx of.... -HTN, high cholesterol, controlled with medications -s/p total  hysterectomy in 03/1998 for fibroids   Socially... -she is married with two kids. She is retired from working as a Psychologist, occupational in Librarian, academic. -brother with prostate cancer around mid 90's (h/o Agent Orange exposure) -drinks alcohol rarely, "a glass of wine maybe every 3-4 months"   GYN HISTORY  Menarchal: 70 years old LMP: age 66 Contraceptive: tubal ligation HRT: used since hysterectomy in 2000 GP: 2, first at age 20    REVIEW OF SYSTEMS:    Constitutional: Denies fevers, chills or abnormal night sweats Eyes: Denies blurriness of vision, double vision or watery eyes Ears, nose, mouth, throat, and face: Denies mucositis or sore throat Respiratory: Denies cough, dyspnea or wheezes Cardiovascular: Denies palpitation, chest discomfort or lower extremity swelling Gastrointestinal:  Denies nausea, heartburn or change in bowel habits, (+) leaking urine Skin: Denies abnormal skin rashes Lymphatics: Denies new lymphadenopathy or easy bruising Neurological:Denies numbness, tingling or new weaknesses Behavioral/Psych: Mood is stable, no new changes  All other systems were reviewed with the patient and are negative.   MEDICAL HISTORY:  Past Medical History:  Diagnosis Date   Blood transfusion without reported diagnosis 03/31/1998   Breast cancer ([place]) 11/24/2020   Hypertension 12/30/1997    SURGICAL HISTORY: Past Surgical History:  Procedure Laterality Date   ABDOMINAL HYSTERECTOMY  03/31/1998   TUBAL LIGATION  08/31/1984    SOCIAL HISTORY: Social History   Socioeconomic History   Marital status: Married    Spouse name: Not on file   Number of children: Not on file   Years of education: Not on file   Highest education level: Not on file  Occupational History   Not on file  Tobacco Use   Smoking status: Never   Smokeless tobacco: Never   Tobacco comments:    I smoked one or two cigarettes when in college.  Substance and Sexual Activity   Alcohol use:  Yes    Comment: occas   Drug use: Never   Sexual activity: Yes    Birth control/protection: Post-menopausal  Other Topics Concern   Not on file  Social History Narrative   Not on file   Social Determinants of Health   Financial Resource Strain: Not on file  Food Insecurity: Not on file  Transportation Needs: Not on file  Physical Activity: Not on file  Stress: Not on file  Social Connections: Not on file  Intimate Partner Violence: Not on file    FAMILY HISTORY: Family History  Problem Relation Age of Onset   Alcohol abuse Father    Diabetes Brother    Hypertension Brother    Kidney disease Brother    Stroke Brother    Prostate cancer Brother 7   Prostate cancer Maternal Uncle 50    ALLERGIES:  has No Known Allergies.  MEDICATIONS:  Current Outpatient Medications  Medication Sig Dispense Refill   aspirin EC 81 MG tablet Take 81 mg by mouth daily. Swallow whole.     atorvastatin (LIPITOR) 40 MG tablet Take 40 mg by mouth daily.     calcitRIOL (ROCALTROL) 0.25 MCG capsule Take 400 mcg by mouth daily. Pt takes 400mg  OTC Calcitriol Daily     carvedilol (COREG CR) 20 MG 24 hr capsule Take 20 mg by mouth daily.     Cholecalciferol (VITAMIN D3) 10 MCG (400 UNIT) CAPS Take 400 Units by mouth daily.     estradiol (ESTRACE) 2 MG tablet Take  2 mg by mouth daily.     hydrochlorothiazide (HYDRODIURIL) 25 MG tablet Take 25 mg by mouth daily.     losartan (COZAAR) 100 MG tablet Take 100 mg by mouth daily.     nisoldipine (SULAR) 17 MG 24 hr tablet Take 17 mg by mouth daily.     No current facility-administered medications for this visit.    PHYSICAL EXAMINATION: ECOG PERFORMANCE STATUS: 1 - Symptomatic but completely ambulatory  Vitals:   12/08/20 0852  BP: (!) 160/60  Pulse: 83  Resp: 18  Temp: 97.9 F (36.6 C)  SpO2: 99%   Filed Weights   12/08/20 0852  Weight: 190 lb 12.8 oz (86.5 kg)    GENERAL:alert, no distress and comfortable SKIN: skin color, texture,  turgor are normal, no rashes or significant lesions EYES: normal, Conjunctiva are pink and non-injected, sclera clear  NECK: supple, thyroid normal size, non-tender, without nodularity LYMPH:  no palpable lymphadenopathy in the cervical, axillary  LUNGS: clear to auscultation and percussion with normal breathing effort HEART: regular rate & rhythm and no murmurs and no lower extremity edema Musculoskeletal:no cyanosis of digits and no clubbing  NEURO: alert & oriented x 3 with fluent speech, no focal motor/sensory deficits BREAST: fullness to biopsy site without palpable mass, likely post-biopsy bleeding. No palpable mass, nodules or adenopathy bilaterally. Breast exam benign.  LABORATORY DATA:  I have reviewed the data as listed CBC Latest Ref Rng & Units 12/08/2020  WBC 4.0 - 10.5 K/uL 10.0  Hemoglobin 12.0 - 15.0 g/dL 12.9  Hematocrit 36.0 - 46.0 % 39.8  Platelets 150 - 400 K/uL 318    CMP Latest Ref Rng & Units 12/08/2020  Glucose 70 - 99 mg/dL 103(H)  BUN 8 - 23 mg/dL 12  Creatinine 0.44 - 1.00 mg/dL 0.68  Sodium 135 - 145 mmol/L 142  Potassium 3.5 - 5.1 mmol/L 3.6  Chloride 98 - 111 mmol/L 106  CO2 22 - 32 mmol/L 25  Calcium 8.9 - 10.3 mg/dL 9.4  Total Protein 6.5 - 8.1 g/dL 7.9  Total Bilirubin 0.3 - 1.2 mg/dL 0.3  Alkaline Phos 38 - 126 U/L 76  AST 15 - 41 U/L 14(L)  ALT 0 - 44 U/L 11     RADIOGRAPHIC STUDIES: I have personally reviewed the radiological images as listed and agreed with the findings in the report. MM Digital Diagnostic Unilat R  Result Date: 11/24/2020 CLINICAL DATA:  70 year old female for further evaluation of new RIGHT breast calcifications identified on screening mammogram. EXAM: DIGITAL DIAGNOSTIC UNILATERAL RIGHT MAMMOGRAM TECHNIQUE: Right digital diagnostic mammography was performed. Mammographic images were processed with CAD. COMPARISON:  Previous exam(s). ACR Breast Density Category c: The breast tissue is heterogeneously dense, which may  obscure small masses. FINDINGS: Full field and magnification views of the RIGHT breast demonstrate 3 separate 0.3 cm groups of slightly heterogeneous calcifications within the UPPER RIGHT breast, middle to posterior depth. Two of these groups of calcifications are immediately adjacent to each other spanning a total distance of 0.9 cm. The other group is located 2 cm inferomedial to the other 2 groups. The entire area spanned by all 3 groups measures 3 cm. IMPRESSION: 1. Three separate 0.3 cm groups of indeterminate calcifications within the UPPER RIGHT breast. Tissue sampling is recommended. RECOMMENDATION: Stereotactic guided RIGHT breast biopsy, 1 or 2 site at the discretion of the performing radiologist. I have discussed the findings and recommendations with the patient. If applicable, a reminder letter will be sent to the patient  regarding the next appointment. BI-RADS CATEGORY  4: Suspicious. Electronically Signed   By: Margarette Canada M.D.   On: 11/24/2020 10:16  MM CLIP PLACEMENT RIGHT  Result Date: 12/02/2020 CLINICAL DATA:  Post biopsy mammogram of the right breast for clip placement. EXAM: 3D DIAGNOSTIC RIGHT MAMMOGRAM POST STEREOTACTIC BIOPSY COMPARISON:  Previous exam(s). FINDINGS: 3D Mammographic images were obtained following stereotactic guided biopsy of 2 groups of calcifications in the superior posterior right breast. The biopsy marking clips are in expected position at the sites of biopsy. IMPRESSION: 1. Appropriate positioning of the coil shaped biopsy marking clip at the site of biopsy in the superior posterior right breast. 2. Appropriate positioning of the ribbon shaped biopsy marking clip in the upper inner posterior right breast. Final Assessment: Post Procedure Mammograms for Marker Placement Electronically Signed   By: Ammie Ferrier M.D.   On: 12/02/2020 09:35  MM RT BREAST BX W LOC DEV 1ST LESION IMAGE BX SPEC STEREO GUIDE  Addendum Date: 12/06/2020   ADDENDUM REPORT: 12/06/2020  08:46 ADDENDUM: Pathology revealed INTERMEDIATE GRADE DUCTAL CARCINOMA IN SITU WITH CALCIFICATIONS AND NECROSIS of the RIGHT breast, superior posterior, (coil clip). This was found to be concordant by Dr. Ammie Ferrier. Pathology revealed INTERMEDIATE GRADE DUCTAL CARCINOMA IN SITU WITH CALCIFICATIONS AND NECROSIS of the RIGHT breast, upper inner, posterior, (ribbon clip). This was found to be concordant by Dr. Ammie Ferrier. Pathology results were discussed with the patient by telephone. The patient reported doing well after the biopsies with tenderness at the sites. Post biopsy instructions and care were reviewed and questions were answered. The patient was encouraged to call [facility] for any additional concerns. My direct phone number was provided. The patient was referred to [facility] on December 08, 2020. Pathology results reported by Terie Purser, RN on 12/03/2020. Electronically Signed   By: Ammie Ferrier M.D.   On: 12/06/2020 08:46   Result Date: 12/06/2020 CLINICAL DATA:  70 year old female presenting for stereotactic biopsy of 2 groups of calcifications in the right breast. EXAM: RIGHT BREAST STEREOTACTIC CORE NEEDLE BIOPSY COMPARISON:  Previous exams. FINDINGS: The patient and I discussed the procedure of stereotactic-guided biopsy including benefits and alternatives. We discussed the high likelihood of a successful procedure. We discussed the risks of the procedure including infection, bleeding, tissue injury, clip migration, and inadequate sampling. Informed written consent was given. The usual time out protocol was performed immediately prior to the procedure. Using sterile technique and 1% Lidocaine as local anesthetic, under stereotactic guidance, a 9 gauge vacuum assisted device was used to perform core needle biopsy of calcifications in the superior posterior right breast using a  superior approach. Specimen radiograph was performed showing calcifications in multiple core samples. Specimens with calcifications are identified for pathology. Lesion quadrant: Upper outer quadrant At the conclusion of the procedure, a coil shaped shaped tissue marker clip was deployed into the biopsy cavity. -------------------------------------------------------------- Using sterile technique and 1% Lidocaine as local anesthetic, under stereotactic guidance, a 9 gauge vacuum assisted device was used to perform core needle biopsy of calcifications in the upper slightly inner posterior right breast using a superior approach. Specimen radiograph was performed showing calcifications in multiple core samples. Specimens with calcifications are identified for pathology. Lesion quadrant: Upper inner quadrant At the conclusion of the procedure, a ribbon shaped shaped tissue marker clip was deployed into the biopsy cavity. Follow-up 2-view mammogram was performed and dictated separately. IMPRESSION:  1. Stereotactic-guided biopsy of calcifications in the superior posterior right breast. No apparent complications. 2. Stereotactic guided biopsy of calcifications in the upper slightly inner posterior right breast. No apparent complications. Electronically Signed: By: Ammie Ferrier M.D. On: 12/02/2020 09:28  MM RT BREAST BX W LOC DEV EA AD LESION IMG BX SPEC STEREO GUIDE  Addendum Date: 12/06/2020   ADDENDUM REPORT: 12/06/2020 08:46 ADDENDUM: Pathology revealed INTERMEDIATE GRADE DUCTAL CARCINOMA IN SITU WITH CALCIFICATIONS AND NECROSIS of the RIGHT breast, superior posterior, (coil clip). This was found to be concordant by Dr. Ammie Ferrier. Pathology revealed INTERMEDIATE GRADE DUCTAL CARCINOMA IN SITU WITH CALCIFICATIONS AND NECROSIS of the RIGHT breast, upper inner, posterior, (ribbon clip). This was found to be concordant by Dr. Ammie Ferrier. Pathology results were discussed with the patient by telephone.  The patient reported doing well after the biopsies with tenderness at the sites. Post biopsy instructions and care were reviewed and questions were answered. The patient was encouraged to call [facility] for any additional concerns. My direct phone number was provided. The patient was referred to [facility] on December 08, 2020. Pathology results reported by Terie Purser, RN on 12/03/2020. Electronically Signed   By: Ammie Ferrier M.D.   On: 12/06/2020 08:46   Result Date: 12/06/2020 CLINICAL DATA:  70 year old female presenting for stereotactic biopsy of 2 groups of calcifications in the right breast. EXAM: RIGHT BREAST STEREOTACTIC CORE NEEDLE BIOPSY COMPARISON:  Previous exams. FINDINGS: The patient and I discussed the procedure of stereotactic-guided biopsy including benefits and alternatives. We discussed the high likelihood of a successful procedure. We discussed the risks of the procedure including infection, bleeding, tissue injury, clip migration, and inadequate sampling. Informed written consent was given. The usual time out protocol was performed immediately prior to the procedure. Using sterile technique and 1% Lidocaine as local anesthetic, under stereotactic guidance, a 9 gauge vacuum assisted device was used to perform core needle biopsy of calcifications in the superior posterior right breast using a superior approach. Specimen radiograph was performed showing calcifications in multiple core samples. Specimens with calcifications are identified for pathology. Lesion quadrant: Upper outer quadrant At the conclusion of the procedure, a coil shaped shaped tissue marker clip was deployed into the biopsy cavity. -------------------------------------------------------------- Using sterile technique and 1% Lidocaine as local anesthetic, under stereotactic guidance, a 9 gauge vacuum assisted device  was used to perform core needle biopsy of calcifications in the upper slightly inner posterior right breast using a superior approach. Specimen radiograph was performed showing calcifications in multiple core samples. Specimens with calcifications are identified for pathology. Lesion quadrant: Upper inner quadrant At the conclusion of the procedure, a ribbon shaped shaped tissue marker clip was deployed into the biopsy cavity. Follow-up 2-view mammogram was performed and dictated separately. IMPRESSION: 1. Stereotactic-guided biopsy of calcifications in the superior posterior right breast. No apparent complications. 2. Stereotactic guided biopsy of calcifications in the upper slightly inner posterior right breast. No apparent complications. Electronically Signed: By: Ammie Ferrier M.D. On: 12/02/2020 09:28    No orders of the defined types were placed in this encounter.   All questions were answered. The patient knows to call the clinic with any problems, questions or concerns. The total time spent in the appointment was 35 minutes.     Truitt Merle, MD 12/08/2020 10:09 PM  I, Wilburn Mylar, am acting as scribe for Truitt Merle, MD.   I have  reviewed the above documentation for accuracy and completeness, and I agree with the above.

## 2020-12-09 ENCOUNTER — Other Ambulatory Visit: Payer: Self-pay | Admitting: *Deleted

## 2020-12-09 DIAGNOSIS — D0511 Intraductal carcinoma in situ of right breast: Secondary | ICD-10-CM

## 2020-12-12 ENCOUNTER — Ambulatory Visit: Payer: Self-pay | Admitting: Surgery

## 2020-12-12 DIAGNOSIS — C50919 Malignant neoplasm of unspecified site of unspecified female breast: Secondary | ICD-10-CM

## 2020-12-14 ENCOUNTER — Encounter: Payer: Self-pay | Admitting: *Deleted

## 2020-12-14 NOTE — Progress Notes (Signed)
[place] CSW Psychosocial Distress Screening  Social Work was referred by distress screening protocol.  The patient scored a 5 on the Psychosocial Distress Thermometer which indicates moderate distress. Social Work Theatre manager contacted patient by phone to assess for distress and other psychosocial needs.  Patient reports that she feels fine physically, but that her biggest challenge right now is accepting and adjusting to her illness. Pt says she has "had a charmed life which is a blessing because I'm 71 years old. This is shocking for me and I don't know what to expect". SW Intern assured her that these are common feelings at that is takes time adjusting to the diagnosis.   Intern and patient discussed other common feeling and emotions when being diagnosed with cancer, and the importance of support during treatment.  Patient reports that she has a solid support network and denies any resource/ financial needs at this time. Intern informed patient of the support team and support services at [facility]  Intern provided contact information and encouraged patient to call with any questions or concerns.   ONCBCN DISTRESS SCREENING 12/14/2020  Screening Type Initial Screening  Distress experienced in past week (1-10) 5  Emotional problem type Nervousness/Anxiety;Adjusting to illness  Spiritual/Religous concerns type Facing my mortality  Physical Problem type Sleep/insomnia   Rosary Lively, Social Work Intern Supervised by Gwinda Maine, LCSW

## 2020-12-15 ENCOUNTER — Other Ambulatory Visit: Payer: Self-pay | Admitting: Surgery

## 2020-12-15 DIAGNOSIS — D0511 Intraductal carcinoma in situ of right breast: Secondary | ICD-10-CM

## 2020-12-16 ENCOUNTER — Telehealth: Payer: Self-pay | Admitting: *Deleted

## 2020-12-16 ENCOUNTER — Encounter: Payer: Self-pay | Admitting: *Deleted

## 2020-12-16 NOTE — Telephone Encounter (Signed)
Spoke with patient from [facility] 11/9 to follow up and assess navigation needs.  Patient denies any questions or concerns at this time. Encouraged her to call should anything arise.

## 2020-12-20 ENCOUNTER — Encounter: Payer: Self-pay | Admitting: Genetic Counselor

## 2020-12-20 ENCOUNTER — Telehealth: Payer: Self-pay | Admitting: Genetic Counselor

## 2020-12-20 DIAGNOSIS — Z1379 Encounter for other screening for genetic and chromosomal anomalies: Secondary | ICD-10-CM | POA: Insufficient documentation

## 2020-12-20 NOTE — Telephone Encounter (Signed)
I contacted Ms. Uballe to discuss her genetic testing results. No pathogenic variants were identified in the 8 genes analyzed. We are still waiting on the analysis of additional genes and will contact her when those are available.  The test report has been scanned into EPIC and is located under the Molecular Pathology section of the Results Review tab.  A portion of the result report is included below for reference. Detailed clinic note to follow.  Lucille Passy, MS, [facility] Genetic Counselor [email or web address] (P) [phone]

## 2020-12-21 ENCOUNTER — Other Ambulatory Visit: Payer: Self-pay

## 2020-12-21 ENCOUNTER — Encounter (HOSPITAL_BASED_OUTPATIENT_CLINIC_OR_DEPARTMENT_OTHER): Payer: Self-pay | Admitting: Surgery

## 2020-12-28 ENCOUNTER — Ambulatory Visit: Admission: RE | Admit: 2020-12-28 | Payer: Medicare Other | Source: Ambulatory Visit

## 2020-12-28 ENCOUNTER — Ambulatory Visit
Admission: RE | Admit: 2020-12-28 | Discharge: 2020-12-28 | Disposition: A | Discharge status: Home / Self Care | Payer: Medicare Other | Source: Ambulatory Visit | Attending: Surgery | Admitting: Surgery

## 2020-12-28 ENCOUNTER — Encounter: Payer: Self-pay | Admitting: *Deleted

## 2020-12-28 DIAGNOSIS — D0511 Intraductal carcinoma in situ of right breast: Secondary | ICD-10-CM

## 2020-12-30 ENCOUNTER — Ambulatory Visit (HOSPITAL_BASED_OUTPATIENT_CLINIC_OR_DEPARTMENT_OTHER): Admission: RE | Admit: 2020-12-30 | Payer: Medicare Other | Source: Home / Self Care | Admitting: Surgery

## 2020-12-30 HISTORY — DX: Anxiety disorder, unspecified: F41.9

## 2020-12-30 SURGERY — BREAST LUMPECTOMY WITH RADIOACTIVE SEED LOCALIZATION
Anesthesia: General | Laterality: Right

## 2021-01-03 ENCOUNTER — Telehealth: Payer: Self-pay | Admitting: Genetic Counselor

## 2021-01-03 NOTE — Telephone Encounter (Signed)
I contacted Tanya Turner to discuss her genetic testing results. No pathogenic variants were identified in the 47 genes analyzed. 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   The test report has been scanned into EPIC and is located under the Molecular Pathology section of the Results Review tab.  A portion of the result report is included below for reference. Detailed clinic note to follow.  Lucille Passy, MS, [facility] Genetic Counselor [email or web address] (P) [phone]

## 2021-01-05 ENCOUNTER — Encounter: Payer: Self-pay | Admitting: *Deleted

## 2021-01-06 ENCOUNTER — Encounter: Payer: Self-pay | Admitting: *Deleted

## 2021-01-10 ENCOUNTER — Ambulatory Visit: Payer: Self-pay | Admitting: Genetic Counselor

## 2021-01-10 DIAGNOSIS — Z1379 Encounter for other screening for genetic and chromosomal anomalies: Secondary | ICD-10-CM

## 2021-01-10 NOTE — Progress Notes (Signed)
HPI:   Ms. Beal was previously seen in the [place] clinic due to a personal and family history of cancer and concerns regarding a hereditary predisposition to cancer. Please refer to our prior cancer genetics clinic note for more information regarding our discussion, assessment and recommendations, at the time. Ms. Honeyman recent genetic test results were disclosed to her, as were recommendations warranted by these results. These results and recommendations are discussed in more detail below.  CANCER HISTORY:  Oncology History Overview Note  Cancer Staging Ductal carcinoma in situ (DCIS) of right breast Staging form: Breast, AJCC 8th Edition - Clinical stage from 12/02/2020: Stage 0 (cTis (DCIS), cN0, cM0, G2) - Signed by Truitt Merle, MD on 12/07/2020    Ductal carcinoma in situ (DCIS) of right breast  11/24/2020 Mammogram   EXAM: DIGITAL DIAGNOSTIC UNILATERAL RIGHT MAMMOGRAM  IMPRESSION: 1. Three separate 0.3 cm groups of indeterminate calcifications within the UPPER RIGHT breast. Tissue sampling is recommended.   12/02/2020 Cancer Staging   Staging form: Breast, AJCC 8th Edition - Clinical stage from 12/02/2020: Stage 0 (cTis (DCIS), cN0, cM0, G2) - Signed by Truitt Merle, MD on 12/07/2020 Stage prefix: Initial diagnosis Histologic grading system: 3 grade system    12/02/2020 Pathology Results   Diagnosis 1. Breast, right, needle core biopsy, superior posterior, coil clip - DUCTAL CARCINOMA IN SITU WITH CALCIFICATIONS AND NECROSIS. - SEE MICROSCOPIC DESCRIPTION. 2. Breast, right, needle core biopsy, upper inner, posterior, ribbon clip - DUCTAL CARCINOMA IN SITU WITH CALCIFICATIONS AND NECROSIS. - SEE MICROSCOPIC DESCRIPTION Microscopic Comment 1. -2. The DCIS has intermediate nuclear grade.  1. PROGNOSTIC INDICATORS Results: Estrogen Receptor: <1%, NEGATIVE Progesterone Receptor: 0%, NEGATIVE   12/06/2020 Initial Diagnosis   Ductal carcinoma in situ (DCIS) of  right breast    Genetic Testing   [facility] CustomNext Panel was Negative. 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 Report date is 12/30/2020.  The CustomNext-Cancer+RNAinsight panel offered by [facility] includes sequencing and rearrangement analysis for the following 47 genes:  APC, ATM, AXIN2, BARD1, BMPR1A, BRCA1, BRCA2, BRIP1, CDH1, CDK4, CDKN2A, CHEK2, CTNNA1, DICER1, EPCAM, GREM1, HOXB13, KIT, MEN1, MLH1, MSH2, MSH3, MSH6, MUTYH, NBN, NF1, NTHL1, PALB2, PDGFRA, PMS2, POLD1, POLE, PTEN, RAD50, RAD51C, RAD51D, SDHA, SDHB, SDHC, SDHD, SMAD4, SMARCA4, STK11, TP53, TSC1, TSC2, and VHL.  RNA data is routinely analyzed for use in variant interpretation for all genes.     FAMILY HISTORY:  We obtained a detailed, 4-generation family history.  Significant diagnoses are listed below:      Family History  Problem Relation Age of Onset   Prostate cancer Brother 54   Prostate cancer Maternal Uncle 26       Ms. Ruffini's brother was diagnosed with prostate cancer at age 33, he was exposed to agent orange and died due to renal failure at age 24. Her maternal half-uncle was diagnosed with prostate cancer at 66 and died due to Alzheimer's.    Ms. Sasaki is unaware of previous family history of genetic testing for hereditary cancer risks. There is no reported Ashkenazi Jewish ancestry.   GENETIC TEST RESULTS:  The [employer] CustomNext Panel found no pathogenic mutations.   The CustomNext-Cancer+RNAinsight panel offered by [facility] F[facility] includes sequencing and rearrangement analysis for the following 47 genes:  APC, ATM, AXIN2, BARD1, BMPR1A, BRCA1, BRCA2, BRIP1, CDH1, CDK4, CDKN2A, CHEK2, CTNNA1, DICER1, EPCAM, GREM1, HOXB13, KIT, MEN1, MLH1, MSH2, MSH3, MSH6, MUTYH, NBN, NF1, NTHL1, PALB2, PDGFRA, PMS2, POLD1, POLE, PTEN,  RAD50, RAD51C, RAD51D, SDHA, SDHB, SDHC, SDHD, SMAD4, SMARCA4, STK11, TP53, TSC1, TSC2, and VHL.  RNA data is routinely analyzed for use in variant  interpretation for all genes.  The test report has been scanned into EPIC and is located under the Molecular Pathology section of the Results Review tab.  A portion of the result report is included below for reference. Genetic testing reported out on 12/30/2020.       Genetic testing identified a variant of uncertain significance (VUS) in the APC gene. At this time, it is unknown if this variant is associated with an increased risk for cancer or if it is benign, but most uncertain variants are reclassified to benign. It should not be used to make medical management decisions. With time, we suspect the laboratory will determine the significance of this variant, if any. If the laboratory reclassifies this variant, we will attempt to contact Ms. Odin to discuss it further.   Even though a pathogenic variant was not identified, possible explanations for the cancer in the family may include: There may be no hereditary risk for cancer in the family. The cancers in Ms. Baratta and/or her family may be due to other genetic or environmental factors. There may be a gene mutation in one of these genes that current testing methods cannot detect, but that chance is small. There could be another gene that has not yet been discovered, or that we have not yet tested, that is responsible for the cancer diagnoses in the family.  It is also possible there is a hereditary cause for the cancer in the family that Ms. Haddox did not inherit.  Therefore, it is important to remain in touch with cancer genetics in the future so that we can continue to offer Ms. Schwabe the most up to date genetic testing.    ADDITIONAL GENETIC TESTING:  We discussed with Ms. Baines that her genetic testing was fairly extensive.  If there are genes identified to increase cancer risk that can be analyzed in the future, we would be happy to discuss and coordinate this testing at that time.    CANCER SCREENING RECOMMENDATIONS:  Ms. Eppard test  result is considered negative (normal). This means that we have not identified a hereditary cause for her personal and family history of cancer at this time. Most cancers happen by chance and this negative test suggests that her cancer may fall into this category.    An individual's cancer risk and medical management are not determined by genetic test results alone. Overall cancer risk assessment incorporates additional factors, including personal medical history, family history, and any available genetic information that may result in a personalized plan for cancer prevention and surveillance. Therefore, it is recommended she continue to follow the cancer management and screening guidelines provided by her oncology and primary healthcare provider.  RECOMMENDATIONS FOR FAMILY MEMBERS:   Since she did not inherit a mutation in a cancer predisposition gene included on this panel, her children could not have inherited a mutation from her in one of these genes. Individuals in this family might be at some increased risk of developing cancer, over the general population risk, due to the family history of cancer. We recommend women in this family have a yearly mammogram beginning at age 52, or 35 years younger than the earliest onset of cancer, an annual clinical breast exam, and perform monthly breast self-exams.   FOLLOW-UP:  Cancer genetics is a rapidly advancing field and it is possible that new  genetic tests will be appropriate for her and/or her family members in the future. We encouraged her to remain in contact with cancer genetics on an annual basis so we can update her personal and family histories and let her know of advances in cancer genetics that may benefit this family.   Our contact number was provided. Ms. Whitmyer questions were answered to her satisfaction, and she knows she is welcome to call us at anytime with additional questions or concerns.   Lucille Passy, MS, [facility] Genetic  Counselor [email or web address] (P) [phone]

## 2021-01-18 ENCOUNTER — Telehealth: Payer: Self-pay | Admitting: *Deleted

## 2021-01-18 NOTE — Telephone Encounter (Signed)
Spoke with patient and she is aware that she will need to Dr. Isidore Moos and [facility] after sx. Message sent to rad onc to cancel sim for 12/23

## 2021-01-20 ENCOUNTER — Encounter: Payer: Self-pay | Admitting: *Deleted

## 2021-01-21 ENCOUNTER — Ambulatory Visit: Payer: Medicare Other

## 2021-01-21 ENCOUNTER — Ambulatory Visit: Payer: Medicare Other | Admitting: Radiation Oncology

## 2021-02-03 ENCOUNTER — Encounter: Payer: Self-pay | Admitting: *Deleted

## 2021-02-08 ENCOUNTER — Encounter: Payer: Self-pay | Admitting: *Deleted

## 2021-02-08 DIAGNOSIS — D0511 Intraductal carcinoma in situ of right breast: Secondary | ICD-10-CM

## 2021-02-25 NOTE — Progress Notes (Signed)
Surgical Instructions    Your procedure is scheduled on March 03, 2021.  Report to [facility] Main Entrance "A" at 7:30 A.M., then check in with the Admitting office.  Call this number if you have problems the morning of surgery:  [phone]   If you have any questions prior to your surgery date call [phone]: Open Monday-Friday 8am-4pm    Remember:  Do not eat after midnight the night before your surgery  You may drink clear liquids until 6:30am the morning of your surgery.   Clear liquids allowed are: Water, Non-Citrus Juices (without pulp), Carbonated Beverages, Clear Tea, Black Coffee ONLY (NO MILK, CREAM OR POWDERED CREAMER of any kind), and Gatorade    Take these medicines the morning of surgery with A SIP OF WATER: Atorvastatin (Lipitor) Carvedilol (Coreg) Nisoldipine (Sular)   As of today, STOP taking any Aspirin (unless otherwise instructed by your surgeon) Aleve, Naproxen, Ibuprofen, Motrin, Advil, Goody's, BC's, all herbal medications, fish oil, and all vitamins.  After your COVID test   You are not required to quarantine however you are required to wear a well-fitting mask when you are out and around people not in your household.  If your mask becomes wet or soiled, replace with a new one.  Wash your hands often with soap and water for 20 seconds or clean your hands with an alcohol-based hand sanitizer that contains at least 60% alcohol.  Do not share personal items.  Notify your provider: if you are in close contact with someone who has COVID  or if you develop a fever of 100.4 or greater, sneezing, cough, sore throat, shortness of breath or body aches.           Do not wear jewelry or makeup Do not wear lotions, powders, perfumes, or deodorant. Do not shave 48 hours prior to surgery.   Do not bring valuables to the hospital. Do not wear nail polish, gel polish, artificial nails, or any other type of covering on natural nails (fingers and toes) If you  have artificial nails or gel coating that need to be removed by a nail salon, please have this removed prior to surgery. Artificial nails or gel coating may interfere with anesthesia's ability to adequately monitor your vital signs.             [facility] is not responsible for any belongings or valuables.  Do NOT Smoke (Tobacco/Vaping)  24 hours prior to your procedure  If you use a CPAP at night, you may bring your mask for your overnight stay.   Contacts, glasses, hearing aids, dentures or partials may not be worn into surgery, please bring cases for these belongings   For patients admitted to the hospital, discharge time will be determined by your treatment team.   Patients discharged the day of surgery will not be allowed to drive home, and someone needs to stay with them for 24 hours.  NO VISITORS WILL BE ALLOWED IN PRE-OP WHERE PATIENTS ARE PREPPED FOR SURGERY.  ONLY 1 SUPPORT PERSON MAY BE PRESENT IN THE WAITING ROOM WHILE YOU ARE IN SURGERY.  IF YOU ARE TO BE ADMITTED, ONCE YOU ARE IN YOUR ROOM YOU WILL BE ALLOWED TWO (2) VISITORS. 1 (ONE) VISITOR MAY STAY OVERNIGHT BUT MUST ARRIVE TO THE ROOM BY 8pm.  Minor children may have two parents present. Special consideration for safety and communication needs will be reviewed on a case by case basis.  Special instructions:    Oral Hygiene is  also important to reduce your risk of infection.  Remember - BRUSH YOUR TEETH THE MORNING OF SURGERY WITH YOUR REGULAR TOOTHPASTE   [place]- Preparing For Surgery  Before surgery, you can play an important role. Because skin is not sterile, your skin needs to be as free of germs as possible. You can reduce the number of germs on your skin by washing with CHG (chlorahexidine gluconate) Soap before surgery.  CHG is an antiseptic cleaner which kills germs and bonds with the skin to continue killing germs even after washing.     Please do not use if you have an allergy to CHG or antibacterial soaps.  If your skin becomes reddened/irritated stop using the CHG.  Do not shave (including legs and underarms) for at least 48 hours prior to first CHG shower. It is OK to shave your face.  Please follow these instructions carefully.     Shower the NIGHT BEFORE SURGERY and the MORNING OF SURGERY with CHG Soap.   If you chose to wash your hair, wash your hair first as usual with your normal shampoo. After you shampoo, rinse your hair and body thoroughly to remove the shampoo.  Then [employer] and genitals (private parts) with your normal soap and rinse thoroughly to remove soap.  After that Use CHG Soap as you would any other liquid soap. You can apply CHG directly to the skin and wash gently with a scrungie or a clean washcloth.   Apply the CHG Soap to your body ONLY FROM THE NECK DOWN.  Do not use on open wounds or open sores. Avoid contact with your eyes, ears, mouth and genitals (private parts). Wash Face and genitals (private parts)  with your normal soap.   Wash thoroughly, paying special attention to the area where your surgery will be performed.  Thoroughly rinse your body with warm water from the neck down.  DO NOT shower/wash with your normal soap after using and rinsing off the CHG Soap.  Pat yourself dry with a CLEAN TOWEL.  Wear CLEAN PAJAMAS to bed the night before surgery  Place CLEAN SHEETS on your bed the night before your surgery  DO NOT SLEEP WITH PETS.   Day of Surgery:  Take a shower with CHG soap. Wear Clean/Comfortable clothing the morning of surgery Do not apply any deodorants/lotions.   Remember to brush your teeth WITH YOUR REGULAR TOOTHPASTE.   Please read over the following fact sheets that you were given.

## 2021-02-28 ENCOUNTER — Encounter (HOSPITAL_COMMUNITY)
Admission: RE | Admit: 2021-02-28 | Discharge: 2021-02-28 | Disposition: A | Discharge status: Home / Self Care | Payer: Medicare Other | Source: Ambulatory Visit | Attending: Surgery | Admitting: Surgery

## 2021-02-28 ENCOUNTER — Encounter (HOSPITAL_COMMUNITY): Payer: Self-pay

## 2021-02-28 ENCOUNTER — Other Ambulatory Visit: Payer: Self-pay

## 2021-02-28 VITALS — BP 161/67 | HR 72 | Temp 98.2°F | Resp 18 | Ht 62.0 in | Wt 191.5 lb

## 2021-02-28 DIAGNOSIS — I10 Essential (primary) hypertension: Secondary | ICD-10-CM | POA: Diagnosis not present

## 2021-02-28 DIAGNOSIS — D0511 Intraductal carcinoma in situ of right breast: Secondary | ICD-10-CM | POA: Diagnosis not present

## 2021-02-28 DIAGNOSIS — Z6835 Body mass index (BMI) 35.0-35.9, adult: Secondary | ICD-10-CM | POA: Diagnosis not present

## 2021-02-28 DIAGNOSIS — E669 Obesity, unspecified: Secondary | ICD-10-CM | POA: Insufficient documentation

## 2021-02-28 DIAGNOSIS — Z90710 Acquired absence of both cervix and uterus: Secondary | ICD-10-CM | POA: Diagnosis not present

## 2021-02-28 DIAGNOSIS — F419 Anxiety disorder, unspecified: Secondary | ICD-10-CM | POA: Insufficient documentation

## 2021-02-28 DIAGNOSIS — Z01818 Encounter for other preprocedural examination: Secondary | ICD-10-CM

## 2021-02-28 LAB — CBC
HCT: 38.9 % (ref 36.0–46.0)
Hemoglobin: 12.2 g/dL (ref 12.0–15.0)
MCH: 27.4 pg (ref 26.0–34.0)
MCHC: 31.4 g/dL (ref 30.0–36.0)
MCV: 87.2 fL (ref 80.0–100.0)
Platelets: 319 10*3/uL (ref 150–400)
RBC: 4.46 MIL/uL (ref 3.87–5.11)
RDW: 13.8 % (ref 11.5–15.5)
WBC: 6.2 10*3/uL (ref 4.0–10.5)
nRBC: 0 % (ref 0.0–0.2)

## 2021-02-28 LAB — BASIC METABOLIC PANEL
Anion gap: 11 (ref 5–15)
BUN: 14 mg/dL (ref 8–23)
CO2: 27 mmol/L (ref 22–32)
Calcium: 9.9 mg/dL (ref 8.9–10.3)
Chloride: 103 mmol/L (ref 98–111)
Creatinine, Ser: 0.65 mg/dL (ref 0.44–1.00)
GFR, Estimated: >60 mL/min (ref >60)
Glucose, Bld: 104 mg/dL — ABNORMAL HIGH (ref 70–99)
Potassium: 3.8 mmol/L (ref 3.5–5.1)
Sodium: 141 mmol/L (ref 135–145)

## 2021-02-28 NOTE — Progress Notes (Signed)
PCP: Dr. [name] Cardiologist: denies  EKG: Today CXR: n/a ECHO: denies Stress Test: denies Cardiac Cath: denies  Covid test: N/A, ambulatory  ERAS: clear liquids  Patient denies shortness of breath, fever, cough, and chest pain at PAT appointment.  Patient verbalized understanding of instructions provided today at the PAT appointment.  Patient asked to review instructions at home and day of surgery.

## 2021-03-01 NOTE — Anesthesia Preprocedure Evaluation (Addendum)
Anesthesia Evaluation  Patient identified by MRN, date of birth, ID band Patient awake    Reviewed: Allergy & Precautions, NPO status , Patient's Chart, lab work & pertinent test results  Airway Mallampati: II  TM Distance: >3 FB Neck ROM: Full    Dental  (+) Teeth Intact, Dental Advisory Given   Pulmonary neg pulmonary ROS,    breath sounds clear to auscultation       Cardiovascular hypertension, Pt. on home beta blockers and Pt. on medications  Rhythm:Regular Rate:Normal     Neuro/Psych Anxiety negative neurological ROS     GI/Hepatic negative GI ROS, Neg liver ROS,   Endo/Other  negative endocrine ROS  Renal/GU negative Renal ROS     Musculoskeletal negative musculoskeletal ROS (+)   Abdominal Normal abdominal exam  (+)   Peds  Hematology negative hematology ROS (+)   Anesthesia Other Findings   Reproductive/Obstetrics                           Anesthesia Physical Anesthesia Plan  ASA: 2  Anesthesia Plan: General   Post-op Pain Management:    Induction: Intravenous  PONV Risk Score and Plan: 4 or greater and Ondansetron, Dexamethasone, Midazolam and Scopolamine patch - Pre-op  Airway Management Planned: LMA  Additional Equipment: None  Intra-op Plan:   Post-operative Plan: Extubation in OR  Informed Consent: I have reviewed the patients History and Physical, chart, labs and discussed the procedure including the risks, benefits and alternatives for the proposed anesthesia with the patient or authorized representative who has indicated his/her understanding and acceptance.     Dental advisory given  Plan Discussed with: CRNA  Anesthesia Plan Comments: (PAT note written 03/01/2021 by Myra Gianotti, PA-C. )      Anesthesia Quick Evaluation

## 2021-03-01 NOTE — Progress Notes (Addendum)
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  History includes never smoker, HTN, breast cancer (12/02/20 right breast biopsy: DCIS), anxiety, hysterectomy (2000 for fibroids).  BMI is consistent with obesity.  Preoperative labs and EKG reviewed. She denied chest pain and SOB per PAT RN interview. RSL is scheduled for 03/02/21 at 1:30 PM.   Anesthesia team to evaluate on the day of surgery.  ADDENDUM 03/02/21 2:12 PM: Received a call for radiology. Patient arrived for RSL with initial SBP readings ~ 190-200 (194/80, 206/82, 197/84). Reportedly patient had taken BP medications. They did repeat after several minutes and SBP came down to < 160, so they are proceeding with radioactive seed implant. Her BP at 02/28/21 PAT was 161/67 with HR 72. She is on nisoldipine 17 mg daily, carvedilol CR 20 mg daily, chlorthalidone 25 mg daily, olmesartan 40 mg daily.  She is to take carvedilol and nisoldipine on the day of surgery. She will get vitals on arrival for surgery. Additional recommendations as indicated based on results.   VS: BP (!) 161/67    Pulse 72    Temp 36.8 C (Oral)    Resp 18    Ht 5\' 2"  (1.575 m)    Wt 86.9 kg    SpO2 100%    BMI 35.03 kg/m    PROVIDERS: [name], MD is PCP  Truitt Merle, MD is HEM-ONC Eppie Gibson, MD is RAD-ONC   LABS: Labs reviewed: Acceptable for surgery. AST 14, ALT 11 on 12/08/20. (all labs ordered are listed, but only abnormal results are displayed)  Labs Reviewed  BASIC METABOLIC PANEL - Abnormal; Notable for the following components:      Result Value   Glucose, Bld 104 (*)    All other components within  normal limits  CBC    EKG: 02/28/21:  Normal sinus rhythm Left ventricular hypertrophy with repolarization abnormality ( R in aVL , Cornell product ) Cannot rule out Septal infarct , age undetermined Abnormal ECG No previous ECGs available Confirmed by Candee Furbish [phone]) on 02/28/2021 1:23:52 PM   CV: N/A   Past Medical History:  Diagnosis Date   Anxiety    Blood transfusion without reported diagnosis 03/31/1998   Breast cancer ([place]) 11/24/2020   Hypertension 12/30/1997    Past Surgical History:  Procedure Laterality Date   ABDOMINAL HYSTERECTOMY  03/31/1998   TUBAL LIGATION  08/31/1984    MEDICATIONS:  OLMESARTAN MEDOXOMIL PO   aspirin EC 81 MG tablet   atorvastatin (LIPITOR) 40 MG tablet   Azilsartan-Chlorthalidone (EDARBYCLOR PO)   Calcium Citrate (CITRACAL PO)   carvedilol (COREG CR) 20 MG 24 hr capsule   chlorthalidone (HYGROTON) 25 MG tablet   Cholecalciferol (VITAMIN D3) 10 MCG (400 UNIT) CAPS   Lysine 1000 MG TABS   nisoldipine (SULAR) 17 MG 24 hr tablet   No current facility-administered medications for this encounter.    Myra Gianotti, PA-C Surgical Short Stay/Anesthesiology [facility] Phone [phone] [facility] Phone [phone] 03/01/2021 12:46 PM

## 2021-03-02 ENCOUNTER — Ambulatory Visit: Admission: RE | Admit: 2021-03-02 | Payer: Medicare Other | Source: Ambulatory Visit

## 2021-03-02 ENCOUNTER — Ambulatory Visit
Admission: RE | Admit: 2021-03-02 | Discharge: 2021-03-02 | Disposition: A | Discharge status: Home / Self Care | Payer: Medicare Other | Source: Ambulatory Visit | Attending: Surgery | Admitting: Surgery

## 2021-03-02 DIAGNOSIS — D0511 Intraductal carcinoma in situ of right breast: Secondary | ICD-10-CM

## 2021-03-03 ENCOUNTER — Encounter (HOSPITAL_COMMUNITY)
Admission: RE | Disposition: A | Discharge status: Home / Self Care | Payer: Self-pay | Source: Home / Self Care | Attending: Surgery

## 2021-03-03 ENCOUNTER — Ambulatory Visit
Admission: RE | Admit: 2021-03-03 | Discharge: 2021-03-03 | Disposition: A | Discharge status: Home / Self Care | Payer: Medicare Other | Source: Ambulatory Visit | Attending: Surgery | Admitting: Surgery

## 2021-03-03 ENCOUNTER — Ambulatory Visit (HOSPITAL_COMMUNITY): Payer: Medicare Other | Admitting: Certified Registered Nurse Anesthetist

## 2021-03-03 ENCOUNTER — Encounter (HOSPITAL_COMMUNITY): Payer: Self-pay | Admitting: Surgery

## 2021-03-03 ENCOUNTER — Other Ambulatory Visit: Payer: Self-pay

## 2021-03-03 ENCOUNTER — Ambulatory Visit (HOSPITAL_COMMUNITY)
Admission: RE | Admit: 2021-03-03 | Discharge: 2021-03-03 | Disposition: A | Discharge status: Home / Self Care | Payer: Medicare Other | Attending: Surgery | Admitting: Surgery

## 2021-03-03 ENCOUNTER — Ambulatory Visit (HOSPITAL_COMMUNITY): Payer: Medicare Other | Admitting: Vascular Surgery

## 2021-03-03 DIAGNOSIS — D0511 Intraductal carcinoma in situ of right breast: Secondary | ICD-10-CM

## 2021-03-03 DIAGNOSIS — I10 Essential (primary) hypertension: Secondary | ICD-10-CM | POA: Diagnosis not present

## 2021-03-03 DIAGNOSIS — N6011 Diffuse cystic mastopathy of right breast: Secondary | ICD-10-CM | POA: Insufficient documentation

## 2021-03-03 HISTORY — PX: BREAST LUMPECTOMY: SHX2

## 2021-03-03 HISTORY — PX: BREAST LUMPECTOMY WITH RADIOACTIVE SEED LOCALIZATION: SHX6424

## 2021-03-03 SURGERY — BREAST LUMPECTOMY WITH RADIOACTIVE SEED LOCALIZATION
Anesthesia: General | Site: Breast | Laterality: Right

## 2021-03-03 MED ORDER — DEXAMETHASONE SODIUM PHOSPHATE 10 MG/ML IJ SOLN
INTRAMUSCULAR | Status: AC
  Filled 2021-03-03: qty 1

## 2021-03-03 MED ORDER — LACTATED RINGERS IV SOLN: INTRAVENOUS | Status: DC

## 2021-03-03 MED ORDER — ONDANSETRON HCL 4 MG/2ML IJ SOLN
INTRAMUSCULAR | Status: DC | PRN
  Administered 2021-03-03: 4 mg via INTRAVENOUS

## 2021-03-03 MED ORDER — PROPOFOL 10 MG/ML IV BOLUS
INTRAVENOUS | Status: DC | PRN
  Administered 2021-03-03: 150 mg via INTRAVENOUS
  Administered 2021-03-03: 50 mg via INTRAVENOUS

## 2021-03-03 MED ORDER — HYDROCODONE-ACETAMINOPHEN 5-325 MG PO TABS: 1.0000 | ORAL_TABLET | Freq: Four times a day (QID) | ORAL | 0 refills | Status: DC | PRN

## 2021-03-03 MED ORDER — PROPOFOL 10 MG/ML IV BOLUS
INTRAVENOUS | Status: AC
  Filled 2021-03-03: qty 20

## 2021-03-03 MED ORDER — PHENYLEPHRINE 40 MCG/ML (10ML) SYRINGE FOR IV PUSH (FOR BLOOD PRESSURE SUPPORT)
PREFILLED_SYRINGE | INTRAVENOUS | Status: DC | PRN
  Administered 2021-03-03 (×7): 80 ug via INTRAVENOUS

## 2021-03-03 MED ORDER — OXYCODONE HCL 5 MG PO TABS
5.0000 mg | ORAL_TABLET | Freq: Once | ORAL | Status: AC
  Administered 2021-03-03: 5 mg via ORAL

## 2021-03-03 MED ORDER — CHLORHEXIDINE GLUCONATE CLOTH 2 % EX PADS: 6.0000 | MEDICATED_PAD | Freq: Once | CUTANEOUS | Status: DC

## 2021-03-03 MED ORDER — CHLORHEXIDINE GLUCONATE 0.12 % MT SOLN
15.0000 mL | Freq: Once | OROMUCOSAL | Status: AC
  Administered 2021-03-03: 15 mL via OROMUCOSAL
  Filled 2021-03-03: qty 15

## 2021-03-03 MED ORDER — MIDAZOLAM HCL 5 MG/5ML IJ SOLN
INTRAMUSCULAR | Status: DC | PRN
  Administered 2021-03-03: 2 mg via INTRAVENOUS

## 2021-03-03 MED ORDER — OXYCODONE HCL 5 MG PO TABS
ORAL_TABLET | ORAL | Status: AC
  Filled 2021-03-03: qty 1

## 2021-03-03 MED ORDER — EPHEDRINE SULFATE-NACL 50-0.9 MG/10ML-% IV SOSY
PREFILLED_SYRINGE | INTRAVENOUS | Status: DC | PRN
Start: 2021-03-03 — End: 2021-03-03
  Administered 2021-03-03: 10 mg via INTRAVENOUS

## 2021-03-03 MED ORDER — GLYCOPYRROLATE PF 0.2 MG/ML IJ SOSY
PREFILLED_SYRINGE | INTRAMUSCULAR | Status: AC
  Filled 2021-03-03: qty 1

## 2021-03-03 MED ORDER — ORAL CARE MOUTH RINSE: 15.0000 mL | Freq: Once | OROMUCOSAL | Status: AC

## 2021-03-03 MED ORDER — DEXAMETHASONE SODIUM PHOSPHATE 4 MG/ML IJ SOLN
INTRAMUSCULAR | Status: DC | PRN
Start: 2021-03-03 — End: 2021-03-03
  Administered 2021-03-03: 5 mg via INTRAVENOUS

## 2021-03-03 MED ORDER — FENTANYL CITRATE (PF) 250 MCG/5ML IJ SOLN
INTRAMUSCULAR | Status: AC
  Filled 2021-03-03: qty 5

## 2021-03-03 MED ORDER — BUPIVACAINE-EPINEPHRINE (PF) 0.25% -1:200000 IJ SOLN
INTRAMUSCULAR | Status: AC
  Filled 2021-03-03: qty 30

## 2021-03-03 MED ORDER — ONDANSETRON HCL 4 MG/2ML IJ SOLN
INTRAMUSCULAR | Status: AC
  Filled 2021-03-03: qty 2

## 2021-03-03 MED ORDER — 0.9 % SODIUM CHLORIDE (POUR BTL) OPTIME
TOPICAL | Status: DC | PRN
  Administered 2021-03-03: 1000 mL

## 2021-03-03 MED ORDER — BUPIVACAINE-EPINEPHRINE 0.25% -1:200000 IJ SOLN
INTRAMUSCULAR | Status: DC | PRN
  Administered 2021-03-03: 20 mL

## 2021-03-03 MED ORDER — LIDOCAINE HCL (CARDIAC) PF 100 MG/5ML IV SOSY
PREFILLED_SYRINGE | INTRAVENOUS | Status: DC | PRN
Start: 2021-03-03 — End: 2021-03-03
  Administered 2021-03-03: 60 mg via INTRAVENOUS

## 2021-03-03 MED ORDER — CEFAZOLIN SODIUM-DEXTROSE 2-4 GM/100ML-% IV SOLN
2.0000 g | INTRAVENOUS | Status: AC
  Administered 2021-03-03: 2 g via INTRAVENOUS
  Filled 2021-03-03: qty 100

## 2021-03-03 MED ORDER — GLYCOPYRROLATE PF 0.2 MG/ML IJ SOSY
PREFILLED_SYRINGE | INTRAMUSCULAR | Status: DC | PRN
Start: 2021-03-03 — End: 2021-03-03
  Administered 2021-03-03: .2 mg via INTRAVENOUS

## 2021-03-03 MED ORDER — LIDOCAINE 2% (20 MG/ML) 5 ML SYRINGE
INTRAMUSCULAR | Status: AC
  Filled 2021-03-03: qty 5

## 2021-03-03 MED ORDER — ACETAMINOPHEN 500 MG PO TABS
1000.0000 mg | ORAL_TABLET | ORAL | Status: AC
  Administered 2021-03-03: 1000 mg via ORAL
  Filled 2021-03-03: qty 2

## 2021-03-03 MED ORDER — MIDAZOLAM HCL 2 MG/2ML IJ SOLN
INTRAMUSCULAR | Status: AC
  Filled 2021-03-03: qty 2

## 2021-03-03 MED ORDER — PHENYLEPHRINE 40 MCG/ML (10ML) SYRINGE FOR IV PUSH (FOR BLOOD PRESSURE SUPPORT)
PREFILLED_SYRINGE | INTRAVENOUS | Status: AC
  Filled 2021-03-03: qty 20

## 2021-03-03 SURGICAL SUPPLY — 39 items
APPLIER CLIP 9.375 MED OPEN (MISCELLANEOUS)
BAG COUNTER SPONGE SURGICOUNT (BAG) ×2 IMPLANT
BINDER BREAST LRG (GAUZE/BANDAGES/DRESSINGS) IMPLANT
BINDER BREAST XLRG (GAUZE/BANDAGES/DRESSINGS) IMPLANT
CANISTER SUCT 3000ML PPV (MISCELLANEOUS) IMPLANT
CHLORAPREP W/TINT 26 (MISCELLANEOUS) ×2 IMPLANT
CLIP APPLIE 9.375 MED OPEN (MISCELLANEOUS) IMPLANT
COVER PROBE W GEL 5X96 (DRAPES) ×2 IMPLANT
COVER SURGICAL LIGHT HANDLE (MISCELLANEOUS) ×2 IMPLANT
DERMABOND ADVANCED (GAUZE/BANDAGES/DRESSINGS) ×1
DERMABOND ADVANCED .7 DNX12 (GAUZE/BANDAGES/DRESSINGS) ×1 IMPLANT
DEVICE DUBIN SPECIMEN MAMMOGRA (MISCELLANEOUS) ×2 IMPLANT
DRAPE CHEST BREAST 15X10 FENES (DRAPES) ×2 IMPLANT
ELECT CAUTERY BLADE 6.4 (BLADE) ×2 IMPLANT
ELECT REM PT RETURN 9FT ADLT (ELECTROSURGICAL) ×2
ELECTRODE REM PT RTRN 9FT ADLT (ELECTROSURGICAL) ×1 IMPLANT
GLOVE SRG 8 PF TXTR STRL LF DI (GLOVE) ×1 IMPLANT
GLOVE SURG ENC MOIS LTX SZ8 (GLOVE) ×2 IMPLANT
GLOVE SURG UNDER POLY LF SZ8 (GLOVE) ×2
GOWN STRL REUS W/ TWL LRG LVL3 (GOWN DISPOSABLE) ×1 IMPLANT
GOWN STRL REUS W/ TWL XL LVL3 (GOWN DISPOSABLE) ×1 IMPLANT
GOWN STRL REUS W/TWL LRG LVL3 (GOWN DISPOSABLE) ×2
GOWN STRL REUS W/TWL XL LVL3 (GOWN DISPOSABLE) ×2
KIT BASIN OR (CUSTOM PROCEDURE TRAY) ×2 IMPLANT
KIT MARKER MARGIN INK (KITS) IMPLANT
LIGHT WAVEGUIDE WIDE FLAT (MISCELLANEOUS) IMPLANT
NDL HYPO 25GX1X1/2 BEV (NEEDLE) IMPLANT
NEEDLE HYPO 25GX1X1/2 BEV (NEEDLE) IMPLANT
NS IRRIG 1000ML POUR BTL (IV SOLUTION) IMPLANT
PACK GENERAL/GYN (CUSTOM PROCEDURE TRAY) ×2 IMPLANT
SUT MNCRL AB 4-0 PS2 18 (SUTURE) ×2 IMPLANT
SUT SILK 2 0 SH (SUTURE) IMPLANT
SUT VIC AB 2-0 SH 27 (SUTURE)
SUT VIC AB 2-0 SH 27XBRD (SUTURE) IMPLANT
SUT VIC AB 3-0 SH 27 (SUTURE)
SUT VIC AB 3-0 SH 27X BRD (SUTURE) IMPLANT
SUT VIC AB 3-0 SH 8-18 (SUTURE) ×2 IMPLANT
SYR CONTROL 10ML LL (SYRINGE) IMPLANT
TOWEL GREEN STERILE FF (TOWEL DISPOSABLE) IMPLANT

## 2021-03-03 NOTE — Interval H&P Note (Signed)
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  The various methods of treatment have been discussed with the patient and family. After consideration of risks, benefits and other options for treatment, the patient has consented to  Procedure(s): RIGHT BREAST LUMPECTOMY x3 (BRACKETED)  WITH RADIOACTIVE SEED LOCALIZATION (Right) as a surgical intervention.  The patient's history has been reviewed, patient examined, no change in status, stable for surgery.  I have reviewed the patient's chart and labs.  Questions were answered to the patient's satisfaction.     [place]

## 2021-03-03 NOTE — Discharge Instructions (Signed)
[facility] Office Phone Number [phone]  BREAST BIOPSY/ PARTIAL MASTECTOMY: POST OP INSTRUCTIONS  Always review your discharge instruction sheet given to you by the facility where your surgery was performed.  IF YOU HAVE DISABILITY OR FAMILY LEAVE FORMS, YOU MUST BRING THEM TO THE OFFICE FOR PROCESSING.  DO NOT GIVE THEM TO YOUR DOCTOR.  A prescription for pain medication may be given to you upon discharge.  Take your pain medication as prescribed, if needed.  If narcotic pain medicine is not needed, then you may take acetaminophen (Tylenol) or ibuprofen (Advil) as needed. Take your usually prescribed medications unless otherwise directed If you need a refill on your pain medication, please contact your pharmacy.  They will contact our office to request authorization.  Prescriptions will not be filled after 5pm or on week-ends. You should eat very light the first 24 hours after surgery, such as soup, crackers, pudding, etc.  Resume your normal diet the day after surgery. Most patients will experience some swelling and bruising in the breast.  Ice packs and a good support bra will help.  Swelling and bruising can take several days to resolve.  It is common to experience some constipation if taking pain medication after surgery.  Increasing fluid intake and taking a stool softener will usually help or prevent this problem from occurring.  A mild laxative (Milk of Magnesia or Miralax) should be taken according to package directions if there are no bowel movements after 48 hours. Unless discharge instructions indicate otherwise, you may remove your bandages 24-48 hours after surgery, and you may shower at that time.  You may have steri-strips (small skin tapes) in place directly over the incision.  These strips should be left on the skin for 7-10 days.  If your surgeon used skin glue on the incision, you may shower in 24 hours.  The glue will flake off over the next 2-3 weeks.  Any  sutures or staples will be removed at the office during your follow-up visit. ACTIVITIES:  You may resume regular daily activities (gradually increasing) beginning the next day.  Wearing a good support bra or sports bra minimizes pain and swelling.  You may have sexual intercourse when it is comfortable. You may drive when you no longer are taking prescription pain medication, you can comfortably wear a seatbelt, and you can safely maneuver your car and apply brakes. RETURN TO WORK:  ______________________________________________________________________________________ You should see your doctor in the office for a follow-up appointment approximately two weeks after your surgery.  Your doctor's nurse will typically make your follow-up appointment when she calls you with your pathology report.  Expect your pathology report 2-3 business days after your surgery.  You may call to check if you do not hear from us after three days. OTHER INSTRUCTIONS: _______________________________________________________________________________________________ _____________________________________________________________________________________________________________________________________ _____________________________________________________________________________________________________________________________________ _____________________________________________________________________________________________________________________________________  WHEN TO CALL YOUR DOCTOR: Fever over 101.0 Nausea and/or vomiting. Extreme swelling or bruising. Continued bleeding from incision. Increased pain, redness, or drainage from the incision.  The clinic staff is available to answer your questions during regular business hours.  Please don't hesitate to call and ask to speak to one of the nurses for clinical concerns.  If you have a medical emergency, go to the nearest emergency room or call 911.  A surgeon from [facility] is always on call at the hospital.  For further questions, please visit [email or web address]   

## 2021-03-03 NOTE — Anesthesia Postprocedure Evaluation (Signed)
Anesthesia Post Note  Patient: Tanya Turner  Procedure(s) Performed: RIGHT BREAST LUMPECTOMY x2 (BRACKETED)  WITH RADIOACTIVE SEED LOCALIZATION (Right: Breast)     Patient location during evaluation: PACU Anesthesia Type: General Level of consciousness: awake and alert Pain management: pain level controlled Vital Signs Assessment: post-procedure vital signs reviewed and stable Respiratory status: spontaneous breathing, nonlabored ventilation, respiratory function stable and patient connected to nasal cannula oxygen Cardiovascular status: blood pressure returned to baseline and stable Postop Assessment: no apparent nausea or vomiting Anesthetic complications: no   No notable events documented.  Last Vitals:  Vitals:   03/03/21 1129 03/03/21 1130  BP: (!) 116/49 (!) 125/49  Pulse: 73 66  Resp: 15 13  Temp: 36.5 C   SpO2: 94% 95%    Last Pain:  Vitals:   03/03/21 1129  TempSrc:   PainSc: 0-No pain                 Effie Berkshire

## 2021-03-03 NOTE — Anesthesia Procedure Notes (Signed)
Procedure Name: LMA Insertion Date/Time: 03/03/2021 10:08 AM Performed by: Michele Rockers, CRNA Pre-anesthesia Checklist: Patient identified, Emergency Drugs available, Suction available and Patient being monitored Patient Re-evaluated:Patient Re-evaluated prior to induction Oxygen Delivery Method: Circle system utilized Preoxygenation: Pre-oxygenation with 100% oxygen Induction Type: IV induction Ventilation: Mask ventilation without difficulty LMA: LMA inserted LMA Size: 4.0 Number of attempts: 1 Placement Confirmation: positive ETCO2 and breath sounds checked- equal and bilateral Tube secured with: Tape Dental Injury: Teeth and Oropharynx as per pre-operative assessment

## 2021-03-03 NOTE — H&P (Signed)
History of Present Illness: Tanya Turner is a 71 y.o. female who is seen today as an office consultation at the request of Dr. Burr Medico for evaluation of Breast Cancer .   Patient seen today in consultation for right breast DCIS in the multidisciplinary breast clinic. This was detected on screening mammogram. She has no history of breast pain, nipple discharge or breast mass. No family history of breast cancer she reports. No other history of breast biopsies in the past.  Review of Systems: A complete review of systems was obtained from the patient. I have reviewed this information and discussed as appropriate with the patient. See HPI as well for other ROS.    Medical History: Past Medical History:  Diagnosis Date   Anemia   Anxiety   History of cancer   Hyperlipidemia   Hypertension   Patient Active Problem List  Diagnosis   Ductal carcinoma in situ (DCIS) of right breast   Past Surgical History:  Procedure Laterality Date   HYSTERECTOMY N/A  03/1998    No Known Allergies  Current Outpatient Medications on File Prior to Visit  Medication Sig Dispense Refill   atorvastatin (LIPITOR) 40 MG tablet   carvedilol (COREG CR) 20 MG CR capsule   estradioL (ESTRACE) 2 MG tablet   hydroCHLOROthiazide (HYDRODIURIL) 25 MG tablet   losartan (COZAAR) 100 MG tablet   nisoldipine (SULAR) 17 MG 24 hr tablet   No current facility-administered medications on file prior to visit.   Family History  Problem Relation Age of Onset   Deep vein thrombosis (DVT or abnormal blood clot formation) Father   Skin cancer Brother   Diabetes Brother   Diabetes Maternal Aunt   Coronary Artery Disease (Blocked arteries around heart) Paternal Uncle    Social History   Tobacco Use  Smoking Status Never  Smokeless Tobacco Never    Social History   Socioeconomic History   Marital status: Unknown  Tobacco Use   Smoking status: Never   Smokeless tobacco: Never  Vaping Use   Vaping Use: Never  used  Substance and Sexual Activity   Alcohol use: Yes  Comment: "rarely"   Drug use: Never   Objective:  There were no vitals filed for this visit.  There is no height or weight on file to calculate BMI.  Physical Exam Constitutional:  Appearance: Normal appearance.  Eyes:  General: No scleral icterus. Pupils: Pupils are equal, round, and reactive to light.  Cardiovascular:  Rate and Rhythm: Normal rate.  Pulmonary:  Effort: Pulmonary effort is normal.  Breath sounds: No stridor.  Chest:  Breasts: Right: Swelling and tenderness present. No mass.  Left: Normal. No mass.  Musculoskeletal:  General: Normal range of motion.  Cervical back: Normal range of motion and neck supple.  Lymphadenopathy:  Upper Body:  Right upper body: No supraclavicular or axillary adenopathy.  Left upper body: No supraclavicular or axillary adenopathy.  Skin: General: Skin is warm and dry.  Neurological:  General: No focal deficit present.  Mental Status: She is alert and oriented to person, place, and time.  Psychiatric:  Mood and Affect: Mood normal.  Behavior: Behavior normal.     Labs, Imaging and Diagnostic Testing: Mammogram showed 2 groups of calcifications the right breast with an adjacent third group. These were in the upper outer quadrant. Final pathology showed DCIS in both specimens which were ER and PR negative. Grade was intermediate  Assessment and Plan:  Diagnoses and all orders for this visit:  Ductal  carcinoma in situ (DCIS) of right breast    Multifocal right breast DCIS. The areas are close enough where a lumpectomy can be performed by bracketing the area. There are 3 areas that looks like. We discussed breast conserving surgery as well as mastectomy reconstruction. We discussed long-term survival of each approach as well as local regional recurrence risks as well with each approach. We discussed cosmesis, recovery, long-term pain and long-term follow-up. She is opted  for bracketed right breast seed lumpectomy. Discussed role of lymph nodes with DCIS.The procedure has been discussed with the patient. Alternatives to surgery have been discussed with the patient. Risks of surgery include bleeding, Infection, Seroma formation, death, and the need for further surgery. The patient understands and wishes to proceed.  No follow-ups on file.  Kennieth Francois, MD

## 2021-03-03 NOTE — Transfer of Care (Signed)
Immediate Anesthesia Transfer of Care Note  Patient: Tanya Turner  Procedure(s) Performed: RIGHT BREAST LUMPECTOMY x2 (BRACKETED)  WITH RADIOACTIVE SEED LOCALIZATION (Right: Breast)  Patient Location: PACU  Anesthesia Type:General  Level of Consciousness: drowsy, patient cooperative and responds to stimulation  Airway & Oxygen Therapy: Patient Spontanous Breathing and Patient connected to nasal cannula oxygen  Post-op Assessment: Report given to RN and Post -op Vital signs reviewed and stable  Post vital signs: Reviewed and stable  Last Vitals:  Vitals Value Taken Time  BP 117/55 03/03/21 1102  Temp    Pulse 79 03/03/21 1103  Resp 21 03/03/21 1103  SpO2 100 % 03/03/21 1103  Vitals shown include unvalidated device data.  Last Pain:  Vitals:   03/03/21 0758  TempSrc:   PainSc: 0-No pain         Complications: No notable events documented.

## 2021-03-03 NOTE — Op Note (Signed)
Preoperative diagnosis: Right breast DCIS upper inner quadrant  Postoperative diagnosis: Same  Procedure: Right breast seed localized lumpectomy utilizing 2 seeds and a bracketed approach  Surgeon: Erroll Luna, MD  Anesthesia: Is LMA with 0.25% Sensorcaine with epinephrine  EBL: 30 cc  Specimen: Right breast tissue with 2 seeds and 2 clips verified by Faxitron  Drains: None  IV fluids: Per anesthesia record  Indications for procedure: Patient is a 71 year old female with right breast DCIS diagnosed by mammogram and subsequent core biopsy.  She opted for breast conserving surgery after discussion of all her options including surgical approaches, long-term expectations, survival, local regional recurrence and quality of life issues long-term.The procedure has been discussed with the patient. Alternatives to surgery have been discussed with the patient.  Risks of surgery include bleeding,  Infection,  Seroma formation, death,  and the need for further surgery.   The patient understands and wishes to proceed.   Description of procedure: The patient was met in the holding area and questions were answered.  The right breast was marked as the correct site and films were available for review.  She was taken back to the operative room.  She was placed supine upon the OR table.  After induction of LMA anesthesia, right breast was prepped and draped in a sterile fashion and a timeout was performed.  Proper patient, site and procedure were verified.  She received appropriate preoperative antibiotics.  Neoprobe was used to identify the seed location in the upper inner quadrant of the right breast.  Procedure identified and marked.  A transverse incision was made over the signal.  Dissection was carried down all tissue around both seeds and both clips was excised with a negative margin grossly.  Irrigation was used.  The Faxitron image revealed the seeds and clips to be in the specimen.  Local anesthetic  was infiltrated throughout the cavity.  After irrigation was performed there is no evidence of any ongoing bleeding.  I then closed the deep layer of the cavity with 3-0 Vicryl.  4 Monocryl is used to close skin in a subcuticular fashion.  Dermabond was applied.  All counts were found to be correct.  Breast binder placed.  The patient was awoke extubated taken to recovery in satisfactory condition.

## 2021-03-04 ENCOUNTER — Encounter (HOSPITAL_COMMUNITY): Payer: Self-pay | Admitting: Surgery

## 2021-03-07 LAB — SURGICAL PATHOLOGY

## 2021-03-08 ENCOUNTER — Encounter: Payer: Self-pay | Admitting: Surgery

## 2021-03-10 ENCOUNTER — Encounter: Payer: Self-pay | Admitting: *Deleted

## 2021-03-31 NOTE — Progress Notes (Signed)
Location of Breast Cancer:  ?Ductal carcinoma in situ (DCIS) of right breast ? ?Histology per Pathology Report:  ?03/03/2021 ?FINAL MICROSCOPIC DIAGNOSIS:  ?A. BREAST, RIGHT, LUMPECTOMY:  ?- Ductal carcinoma in situ with necrosis, 0.5 cm.  ?- All surgical margins negative for DCIS.  ?- Fibrocystic changes with calcifications.  ?- Biopsy site and biopsy clip.  ?- See oncology table.  ? ?Receptor Status: ER(Negative), PR (Negative) ? ?Did patient present with symptoms (if so, please note symptoms) or was this found on screening mammography?: Routine screening mammography on 11/02/20 showing a possible abnormality in the right breast. She underwent right diagnostic mammography on 11/24/20 showing: three separate 0.3 cm groups of indeterminate calcifications in upper right breast. ? ?Past/Anticipated interventions by surgeon, if any:  ?03/03/2021 ?--Dr. Erroll Luna  ?Right breast seed localized lumpectomy utilizing 2 seeds and a bracketed approach ? ?Past/Anticipated interventions by medical oncology, if any:  ?12/08/2020 ?--Dr. Truitt Merle ?She is a candidate for breast conservation surgery. She has been seen by breast surgeon Dr. Brantley Stage, who recommends lumpectomy. ?Her DCIS will be cured by complete surgical resection. Any form of adjuvant therapy is preventive. ?Given her negative ER and PR, I do not recommend chemoprevention with antiestrogen therapy. ?She will likely benefit from breast radiation if she undergo lumpectomy to decrease the risk of breast cancer. ?We also discussed that biopsy may have sampling limitation, we will review her surgical path, to see if she has any invasive carcinoma components. ?We discussed breast cancer surveillance after she completes treatment, Including annual mammogram, breast exam every 6-12 months. ?PLAN:  ?she will proceed with lumpectomy with Dr. Brantley Stage soon. ?I will see her as needed. ? ?Lymphedema issues, if any:  Patient denies  ? ?Pain issues, if any:  Denies any breast  pain, but does report occasional pains to her right shoulder when she changes positions   ? ?SAFETY ISSUES: ?Prior radiation? No ?Pacemaker/ICD? No ?Possible current pregnancy? No--hysterectomy ?Is the patient on methotrexate? No ? ?Current Complaints / other details:  Nothing else of note ?   ? ? ? ?

## 2021-03-31 NOTE — Progress Notes (Signed)
Radiation Oncology         [phone] ________________________________  Name: Tanya Turner MRN: [id number]  Date: 04/01/2021  DOB: [birth year]  Follow-Up Visit Note  Outpatient  CC: [name], MD  Truitt Merle, MD  Diagnosis:      ICD-10-CM   1. Ductal carcinoma in situ (DCIS) of right breast  D05.11        Stage 0 (cTis (DCIS), cN0, cM0) Right Breast, High-grade ductal carcinoma in-situ, ER- / PR- / Her2 not assessed  pTis pNX  CHIEF COMPLAINT: Here to discuss management of right breast DCIS  Narrative:  The patient returns today for follow-up.     Since breast clinic consultation date of 12/08/20, she underwent genetic testing on that same date which revealed no clinically significant variants detected by BRCAplus or +RNAinsight testing. 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    The patient opted to proceed with right breast lumpectomy on 03/03/21 under the care of Dr. Brantley Stage. Pathology from the procedure revealed: tumor size of 0.5 cm; histology of high-grade ductal carcinoma in-situ with necrosis, fibrocystic changes and calcifications; all margins negative for DCIS; margin status to in situ disease of 5 mm from the superior margin; no lymph nodes were examined;  ER status: <1% negative; PR status 0% negative, Her2 not assessed.   Per her most recent post-op check up with Dr. Brantley Stage on 03/28/21, the patient is healing well other than reports of a rash on the right breast. She is using benadryl and hydrocortisone for this with good relief.   Symptomatically, the patient reports:  Lymphedema issues, if any:  Patient denies   Pain issues, if any:  Denies any breast pain, but does report occasional pains to her right shoulder when she changes positions    SAFETY ISSUES: Prior radiation? No Pacemaker/ICD? No Possible current pregnancy? No--hysterectomy Is the patient on methotrexate? No  Current  Complaints / other details:  Nothing else of note           ALLERGIES:  has No Known Allergies.  Meds: Current Outpatient Medications  Medication Sig Dispense Refill   aspirin EC 81 MG tablet Take 81 mg by mouth daily. Swallow whole.     atorvastatin (LIPITOR) 40 MG tablet Take 40 mg by mouth daily.     Calcium Citrate (CITRACAL PO) Take 1 tablet by mouth daily.     carvedilol (COREG CR) 20 MG 24 hr capsule Take 20 mg by mouth daily.     chlorthalidone (HYGROTON) 25 MG tablet Take 25 mg by mouth daily.     Cholecalciferol (VITAMIN D3) 10 MCG (400 UNIT) CAPS Take 400 Units by mouth daily.     Lysine 1000 MG TABS Take 1,000 mg by mouth daily.     nisoldipine (SULAR) 17 MG 24 hr tablet Take 17 mg by mouth daily.     olmesartan (BENICAR) 40 MG tablet Take 40 mg by mouth daily.     No current facility-administered medications for this encounter.    Physical Findings:  height is $RemoveB'5\' 2"'eCUDFVyv$  (1.575 m) and weight is 195 lb 6 oz (88.6 kg). Her temporal temperature is 97.1 F (36.2 C) (abnormal). Her blood pressure is 168/58 (abnormal) and her pulse is 82. Her respiration is 18 and oxygen saturation is 99%. .     General: Alert and oriented, in no acute distress Psychiatric: Judgment and insight are intact. Affect is appropriate. Skin: no rashes over right  breast or axilla. Breast exam reveals excellent healing at lumpectomy site, right breast  Lab Findings: Lab Results  Component Value Date   WBC 6.2 02/28/2021   HGB 12.2 02/28/2021   HCT 38.9 02/28/2021   MCV 87.2 02/28/2021   PLT 319 02/28/2021    '@LASTCHEMISTRY'$ @  Radiographic Findings: MM Breast Surgical Specimen  Result Date: 03/03/2021 CLINICAL DATA:  Post lumpectomy specimen radiograph EXAM: SPECIMEN RADIOGRAPH OF THE RIGHT BREAST COMPARISON:  Previous exam(s). FINDINGS: Status post excision of the right breast. The two radioactive seeds and two biopsy marker clips are present, completely intact, and were marked for pathology.  IMPRESSION: Specimen radiograph of the right breast. These results were called by telephone at the time of interpretation on 03/03/2021 at 10:43 am to provider [facility] , who verbally acknowledged these results. Electronically Signed   By: Audie Pinto M.D.   On: 03/03/2021 10:43  MM RT RADIOACTIVE SEED LOC MAMMO GUIDE  Result Date: 03/02/2021 CLINICAL DATA:  71 year old female with recently diagnosed DCIS of the right breast at site of ribbon and coil shaped biopsy marking clips. The patient does have an additional site of calcifications immediately adjacent to the coil shaped biopsy marking clip in the upper-outer right breast which will be included in the excision. EXAM: MAMMOGRAPHIC GUIDED RADIOACTIVE SEED LOCALIZATION OF THE RIGHT BREAST COMPARISON:  Previous exam(s). FINDINGS: Patient presents for radioactive seed localization prior to right breast lumpectomy. I met with the patient and we discussed the procedure of seed localization including benefits and alternatives. We discussed the high likelihood of a successful procedure. We discussed the risks of the procedure including infection, bleeding, tissue injury and further surgery. We discussed the low dose of radioactivity involved in the procedure. Informed, written consent was given. The usual time-out protocol was performed immediately prior to the procedure. SITE 1: RIGHT BREAST RIBBON SHAPED BIOPSY MARKING CLIP/DCIS: Using mammographic guidance, sterile technique, 1% lidocaine and an I-125 radioactive seed, the ribbon shaped biopsy marking clip was localized using a superior to inferior approach. The follow-up mammogram images confirm the seed in the expected location and were marked for Dr. Brantley Stage. Follow-up survey of the patient confirms presence of the radioactive seed. Order number of I-125 seed:  [id number]. Total activity:  5.035 millicuries reference Date: 02/16/2021 SITE 2: RIGHT BREAST COIL SHAPED BIOPSY MARKING CLIP: Using  mammographic guidance, sterile technique, 1% lidocaine and an I-125 radioactive seed, the coil shaped biopsy marking clip and adjacent calcifications were localized using a superior to inferior approach. The follow-up mammogram images confirm the seed in the expected location and were marked for Dr. Brantley Stage. Follow-up survey of the patient confirms presence of the radioactive seed. Order number of I-125 seed:  [id number]. Total activity:  1.700 millicuries reference Date: 12/29/2020 The patient tolerated the procedure well and was released from the [facility]. She was given instructions regarding seed removal. IMPRESSION: Radioactive seed localization right breast breast, 2 sites. No apparent complications. Electronically Signed   By: Everlean Alstrom M.D.   On: 03/02/2021 14:48  MM RT RADIO SEED EA ADD LESION LOC MAMMO  Result Date: 03/02/2021 CLINICAL DATA:  71 year old female with recently diagnosed DCIS of the right breast at site of ribbon and coil shaped biopsy marking clips. The patient does have an additional site of calcifications immediately adjacent to the coil shaped biopsy marking clip in the upper-outer right breast which will be included in the excision. EXAM: MAMMOGRAPHIC GUIDED RADIOACTIVE SEED LOCALIZATION OF THE RIGHT BREAST COMPARISON:  Previous exam(s).  FINDINGS: Patient presents for radioactive seed localization prior to right breast lumpectomy. I met with the patient and we discussed the procedure of seed localization including benefits and alternatives. We discussed the high likelihood of a successful procedure. We discussed the risks of the procedure including infection, bleeding, tissue injury and further surgery. We discussed the low dose of radioactivity involved in the procedure. Informed, written consent was given. The usual time-out protocol was performed immediately prior to the procedure. SITE 1: RIGHT BREAST RIBBON SHAPED BIOPSY MARKING CLIP/DCIS: Using mammographic guidance,  sterile technique, 1% lidocaine and an I-125 radioactive seed, the ribbon shaped biopsy marking clip was localized using a superior to inferior approach. The follow-up mammogram images confirm the seed in the expected location and were marked for Dr. Brantley Stage. Follow-up survey of the patient confirms presence of the radioactive seed. Order number of I-125 seed:  [id number]. Total activity:  7.564 millicuries reference Date: 02/16/2021 SITE 2: RIGHT BREAST COIL SHAPED BIOPSY MARKING CLIP: Using mammographic guidance, sterile technique, 1% lidocaine and an I-125 radioactive seed, the coil shaped biopsy marking clip and adjacent calcifications were localized using a superior to inferior approach. The follow-up mammogram images confirm the seed in the expected location and were marked for Dr. Brantley Stage. Follow-up survey of the patient confirms presence of the radioactive seed. Order number of I-125 seed:  [id number]. Total activity:  1.660 millicuries reference Date: 12/29/2020 The patient tolerated the procedure well and was released from the [facility]. She was given instructions regarding seed removal. IMPRESSION: Radioactive seed localization right breast breast, 2 sites. No apparent complications. Electronically Signed   By: Everlean Alstrom M.D.   On: 03/02/2021 14:48   Impression/Plan: right breast DCIS, high grade, ER neg, + necrosis  We discussed adjuvant radiotherapy today.  I recommend 4 weeks of radiation therapy to the right breast in order to minimize risk of locoregional recurrence.  I reviewed the logistics, benefits, risks, and potential side effects of this treatment in detail. Risks may include but not necessary be limited to acute and late injury tissue in the radiation fields such as skin irritation (change in color/pigmentation, itching, dryness, pain, peeling). She may experience fatigue. We also discussed possible risk of long term cosmetic changes or scar tissue. There is also a smaller risk  for lung toxicity, lymphedema, musculoskeletal changes, rib fragility or induction of a second malignancy, late chronic non-healing soft tissue wound.    The patient asked good questions which I answered to her satisfaction. She is enthusiastic about proceeding with treatment. A consent form has been signed and placed in her chart.  Proceed with CT simulation today.  Start treatment in approximately 1 week.  On date of service, in total, I spent 30 minutes on this encounter. Patient was seen in person.  _____________________________________   Eppie Gibson, MD  This document serves as a record of services personally performed by Eppie Gibson, MD. It was created on her behalf by Roney Mans, a trained medical scribe. The creation of this record is based on the scribe's personal observations and the provider's statements to them. This document has been checked and approved by the attending provider.

## 2021-04-01 ENCOUNTER — Ambulatory Visit
Admission: RE | Admit: 2021-04-01 | Discharge: 2021-04-01 | Disposition: A | Discharge status: Home / Self Care | Payer: Medicare Other | Source: Ambulatory Visit | Attending: Radiation Oncology | Admitting: Radiation Oncology

## 2021-04-01 ENCOUNTER — Other Ambulatory Visit: Payer: Self-pay

## 2021-04-01 ENCOUNTER — Encounter: Payer: Self-pay | Admitting: Radiation Oncology

## 2021-04-01 VITALS — BP 168/58 | HR 82 | Temp 97.1°F | Resp 18 | Ht 62.0 in | Wt 195.4 lb

## 2021-04-01 DIAGNOSIS — Z79899 Other long term (current) drug therapy: Secondary | ICD-10-CM | POA: Diagnosis not present

## 2021-04-01 DIAGNOSIS — Z510 Encounter for antineoplastic radiation therapy: Secondary | ICD-10-CM | POA: Insufficient documentation

## 2021-04-01 DIAGNOSIS — D0511 Intraductal carcinoma in situ of right breast: Secondary | ICD-10-CM | POA: Insufficient documentation

## 2021-04-01 DIAGNOSIS — Z7982 Long term (current) use of aspirin: Secondary | ICD-10-CM | POA: Diagnosis not present

## 2021-04-01 DIAGNOSIS — Z171 Estrogen receptor negative status [ER-]: Secondary | ICD-10-CM | POA: Insufficient documentation

## 2021-04-06 DIAGNOSIS — Z510 Encounter for antineoplastic radiation therapy: Secondary | ICD-10-CM | POA: Diagnosis not present

## 2021-04-08 ENCOUNTER — Encounter: Payer: Self-pay | Admitting: *Deleted

## 2021-04-11 ENCOUNTER — Ambulatory Visit
Admission: RE | Admit: 2021-04-11 | Discharge: 2021-04-11 | Disposition: A | Discharge status: Home / Self Care | Payer: Medicare Other | Source: Ambulatory Visit | Attending: Radiation Oncology | Admitting: Radiation Oncology

## 2021-04-11 ENCOUNTER — Telehealth: Payer: Self-pay | Admitting: Hematology

## 2021-04-11 DIAGNOSIS — Z510 Encounter for antineoplastic radiation therapy: Secondary | ICD-10-CM | POA: Diagnosis not present

## 2021-04-11 NOTE — Telephone Encounter (Signed)
Sch per 3/10 inbasket , unable to leave message, printed calendar for front desk to deliver on 3/14. ?

## 2021-04-12 ENCOUNTER — Ambulatory Visit
Admission: RE | Admit: 2021-04-12 | Discharge: 2021-04-12 | Disposition: A | Discharge status: Home / Self Care | Payer: Medicare Other | Source: Ambulatory Visit | Attending: Radiation Oncology | Admitting: Radiation Oncology

## 2021-04-12 ENCOUNTER — Other Ambulatory Visit: Payer: Self-pay

## 2021-04-12 DIAGNOSIS — Z510 Encounter for antineoplastic radiation therapy: Secondary | ICD-10-CM | POA: Diagnosis not present

## 2021-04-12 DIAGNOSIS — D0511 Intraductal carcinoma in situ of right breast: Secondary | ICD-10-CM

## 2021-04-12 MED ORDER — RADIAPLEXRX EX GEL: Freq: Once | CUTANEOUS | Status: AC

## 2021-04-12 NOTE — Progress Notes (Signed)
Pt here for patient teaching.   ? ?Pt given Radiation and You booklet, skin care instructions, and Radiaplex gel. Alra remains on backorder (patient advised to use OTC aluminum free deodorant in the interim)  ? ?Reviewed areas of pertinence such as fatigue, hair loss, skin changes, breast tenderness, and breast swelling .  ? ?Pt able to give teach back of to pat skin, use unscented/gentle soap, and drink plenty of water,apply Radiaplex bid, avoid applying anything to skin within 4 hours of treatment, avoid wearing an under wire bra, and to use an electric razor if they must shave.  ? ?Pt demonstrated understanding and verbalizes understanding of information given and will contact nursing with any questions or concerns.   ? ?[email or web address] ?  ? ? ? ? ? ? ?

## 2021-04-13 ENCOUNTER — Ambulatory Visit
Admission: RE | Admit: 2021-04-13 | Discharge: 2021-04-13 | Disposition: A | Discharge status: Home / Self Care | Payer: Medicare Other | Source: Ambulatory Visit | Attending: Radiation Oncology | Admitting: Radiation Oncology

## 2021-04-13 DIAGNOSIS — Z510 Encounter for antineoplastic radiation therapy: Secondary | ICD-10-CM | POA: Diagnosis not present

## 2021-04-14 ENCOUNTER — Encounter (HOSPITAL_COMMUNITY): Payer: Self-pay

## 2021-04-14 ENCOUNTER — Encounter: Payer: Self-pay | Admitting: *Deleted

## 2021-04-14 ENCOUNTER — Other Ambulatory Visit: Payer: Self-pay

## 2021-04-14 ENCOUNTER — Ambulatory Visit
Admission: RE | Admit: 2021-04-14 | Discharge: 2021-04-14 | Disposition: A | Discharge status: Home / Self Care | Payer: Medicare Other | Source: Ambulatory Visit | Attending: Radiation Oncology | Admitting: Radiation Oncology

## 2021-04-14 DIAGNOSIS — Z510 Encounter for antineoplastic radiation therapy: Secondary | ICD-10-CM | POA: Diagnosis not present

## 2021-04-15 ENCOUNTER — Ambulatory Visit
Admission: RE | Admit: 2021-04-15 | Discharge: 2021-04-15 | Disposition: A | Discharge status: Home / Self Care | Payer: Medicare Other | Source: Ambulatory Visit | Attending: Radiation Oncology | Admitting: Radiation Oncology

## 2021-04-15 DIAGNOSIS — Z510 Encounter for antineoplastic radiation therapy: Secondary | ICD-10-CM | POA: Diagnosis not present

## 2021-04-18 ENCOUNTER — Other Ambulatory Visit: Payer: Self-pay

## 2021-04-18 ENCOUNTER — Ambulatory Visit
Admission: RE | Admit: 2021-04-18 | Discharge: 2021-04-18 | Disposition: A | Discharge status: Home / Self Care | Payer: Medicare Other | Source: Ambulatory Visit | Attending: Radiation Oncology | Admitting: Radiation Oncology

## 2021-04-18 DIAGNOSIS — Z510 Encounter for antineoplastic radiation therapy: Secondary | ICD-10-CM | POA: Diagnosis not present

## 2021-04-19 ENCOUNTER — Ambulatory Visit
Admission: RE | Admit: 2021-04-19 | Discharge: 2021-04-19 | Disposition: A | Discharge status: Home / Self Care | Payer: Medicare Other | Source: Ambulatory Visit | Attending: Radiation Oncology | Admitting: Radiation Oncology

## 2021-04-19 DIAGNOSIS — Z510 Encounter for antineoplastic radiation therapy: Secondary | ICD-10-CM | POA: Diagnosis not present

## 2021-04-20 ENCOUNTER — Other Ambulatory Visit: Payer: Self-pay

## 2021-04-20 ENCOUNTER — Ambulatory Visit
Admission: RE | Admit: 2021-04-20 | Discharge: 2021-04-20 | Disposition: A | Discharge status: Home / Self Care | Payer: Medicare Other | Source: Ambulatory Visit | Attending: Radiation Oncology | Admitting: Radiation Oncology

## 2021-04-20 DIAGNOSIS — Z510 Encounter for antineoplastic radiation therapy: Secondary | ICD-10-CM | POA: Diagnosis not present

## 2021-04-21 ENCOUNTER — Ambulatory Visit
Admission: RE | Admit: 2021-04-21 | Discharge: 2021-04-21 | Disposition: A | Discharge status: Home / Self Care | Payer: Medicare Other | Source: Ambulatory Visit | Attending: Radiation Oncology | Admitting: Radiation Oncology

## 2021-04-21 DIAGNOSIS — Z510 Encounter for antineoplastic radiation therapy: Secondary | ICD-10-CM | POA: Diagnosis not present

## 2021-04-22 ENCOUNTER — Ambulatory Visit
Admission: RE | Admit: 2021-04-22 | Discharge: 2021-04-22 | Disposition: A | Discharge status: Home / Self Care | Payer: Medicare Other | Source: Ambulatory Visit | Attending: Radiation Oncology | Admitting: Radiation Oncology

## 2021-04-22 ENCOUNTER — Other Ambulatory Visit: Payer: Self-pay

## 2021-04-22 DIAGNOSIS — Z510 Encounter for antineoplastic radiation therapy: Secondary | ICD-10-CM | POA: Diagnosis not present

## 2021-04-25 ENCOUNTER — Ambulatory Visit: Admission: RE | Admit: 2021-04-25 | Payer: Medicare Other | Source: Ambulatory Visit

## 2021-04-25 ENCOUNTER — Ambulatory Visit: Admission: RE | Admit: 2021-04-25 | Payer: Medicare Other | Source: Ambulatory Visit | Admitting: Radiation Oncology

## 2021-04-26 ENCOUNTER — Ambulatory Visit
Admission: RE | Admit: 2021-04-26 | Discharge: 2021-04-26 | Disposition: A | Discharge status: Home / Self Care | Payer: Medicare Other | Source: Ambulatory Visit | Attending: Radiation Oncology | Admitting: Radiation Oncology

## 2021-04-26 ENCOUNTER — Other Ambulatory Visit: Payer: Self-pay

## 2021-04-26 DIAGNOSIS — Z510 Encounter for antineoplastic radiation therapy: Secondary | ICD-10-CM | POA: Diagnosis not present

## 2021-04-26 DIAGNOSIS — D0511 Intraductal carcinoma in situ of right breast: Secondary | ICD-10-CM

## 2021-04-26 MED ORDER — RADIAPLEXRX EX GEL: Freq: Once | CUTANEOUS | Status: AC

## 2021-04-27 ENCOUNTER — Ambulatory Visit
Admission: RE | Admit: 2021-04-27 | Discharge: 2021-04-27 | Disposition: A | Discharge status: Home / Self Care | Payer: Medicare Other | Source: Ambulatory Visit | Attending: Radiation Oncology | Admitting: Radiation Oncology

## 2021-04-27 DIAGNOSIS — Z510 Encounter for antineoplastic radiation therapy: Secondary | ICD-10-CM | POA: Diagnosis not present

## 2021-04-28 ENCOUNTER — Ambulatory Visit
Admission: RE | Admit: 2021-04-28 | Discharge: 2021-04-28 | Disposition: A | Discharge status: Home / Self Care | Payer: Medicare Other | Source: Ambulatory Visit | Attending: Radiation Oncology | Admitting: Radiation Oncology

## 2021-04-28 ENCOUNTER — Other Ambulatory Visit: Payer: Self-pay

## 2021-04-28 DIAGNOSIS — Z510 Encounter for antineoplastic radiation therapy: Secondary | ICD-10-CM | POA: Diagnosis not present

## 2021-04-29 ENCOUNTER — Ambulatory Visit
Admission: RE | Admit: 2021-04-29 | Discharge: 2021-04-29 | Disposition: A | Discharge status: Home / Self Care | Payer: Medicare Other | Source: Ambulatory Visit | Attending: Radiation Oncology | Admitting: Radiation Oncology

## 2021-04-29 DIAGNOSIS — Z510 Encounter for antineoplastic radiation therapy: Secondary | ICD-10-CM | POA: Diagnosis not present

## 2021-05-02 ENCOUNTER — Ambulatory Visit
Admission: RE | Admit: 2021-05-02 | Discharge: 2021-05-02 | Disposition: A | Discharge status: Home / Self Care | Payer: Medicare Other | Source: Ambulatory Visit | Attending: Radiation Oncology | Admitting: Radiation Oncology

## 2021-05-02 ENCOUNTER — Ambulatory Visit: Payer: Medicare Other

## 2021-05-02 ENCOUNTER — Other Ambulatory Visit: Payer: Self-pay

## 2021-05-02 DIAGNOSIS — Z510 Encounter for antineoplastic radiation therapy: Secondary | ICD-10-CM | POA: Insufficient documentation

## 2021-05-02 DIAGNOSIS — D0511 Intraductal carcinoma in situ of right breast: Secondary | ICD-10-CM | POA: Diagnosis present

## 2021-05-02 DIAGNOSIS — Z79899 Other long term (current) drug therapy: Secondary | ICD-10-CM | POA: Diagnosis not present

## 2021-05-02 DIAGNOSIS — Z7982 Long term (current) use of aspirin: Secondary | ICD-10-CM | POA: Insufficient documentation

## 2021-05-02 DIAGNOSIS — Z171 Estrogen receptor negative status [ER-]: Secondary | ICD-10-CM | POA: Diagnosis not present

## 2021-05-03 ENCOUNTER — Ambulatory Visit: Payer: Medicare Other

## 2021-05-03 ENCOUNTER — Ambulatory Visit
Admission: RE | Admit: 2021-05-03 | Discharge: 2021-05-03 | Disposition: A | Discharge status: Home / Self Care | Payer: Medicare Other | Source: Ambulatory Visit | Attending: Radiation Oncology | Admitting: Radiation Oncology

## 2021-05-03 DIAGNOSIS — D0511 Intraductal carcinoma in situ of right breast: Secondary | ICD-10-CM

## 2021-05-03 DIAGNOSIS — Z510 Encounter for antineoplastic radiation therapy: Secondary | ICD-10-CM | POA: Diagnosis not present

## 2021-05-03 MED ORDER — RADIAPLEXRX EX GEL: Freq: Once | CUTANEOUS | Status: AC

## 2021-05-04 ENCOUNTER — Ambulatory Visit: Payer: Medicare Other

## 2021-05-04 ENCOUNTER — Ambulatory Visit
Admission: RE | Admit: 2021-05-04 | Discharge: 2021-05-04 | Disposition: A | Discharge status: Home / Self Care | Payer: Medicare Other | Source: Ambulatory Visit | Attending: Radiation Oncology | Admitting: Radiation Oncology

## 2021-05-04 ENCOUNTER — Other Ambulatory Visit: Payer: Self-pay

## 2021-05-04 DIAGNOSIS — Z510 Encounter for antineoplastic radiation therapy: Secondary | ICD-10-CM | POA: Diagnosis not present

## 2021-05-05 ENCOUNTER — Inpatient Hospital Stay (HOSPITAL_BASED_OUTPATIENT_CLINIC_OR_DEPARTMENT_OTHER): Payer: Medicare Other | Admitting: Hematology

## 2021-05-05 ENCOUNTER — Ambulatory Visit
Admission: RE | Admit: 2021-05-05 | Discharge: 2021-05-05 | Disposition: A | Discharge status: Home / Self Care | Payer: Medicare Other | Source: Ambulatory Visit | Attending: Radiation Oncology | Admitting: Radiation Oncology

## 2021-05-05 ENCOUNTER — Encounter: Payer: Self-pay | Admitting: Hematology

## 2021-05-05 ENCOUNTER — Ambulatory Visit: Payer: Medicare Other

## 2021-05-05 VITALS — BP 171/61 | HR 69 | Temp 98.2°F | Resp 18 | Ht 62.0 in | Wt 192.1 lb

## 2021-05-05 DIAGNOSIS — Z79899 Other long term (current) drug therapy: Secondary | ICD-10-CM | POA: Insufficient documentation

## 2021-05-05 DIAGNOSIS — D0511 Intraductal carcinoma in situ of right breast: Secondary | ICD-10-CM | POA: Insufficient documentation

## 2021-05-05 DIAGNOSIS — Z510 Encounter for antineoplastic radiation therapy: Secondary | ICD-10-CM | POA: Diagnosis not present

## 2021-05-05 DIAGNOSIS — Z7982 Long term (current) use of aspirin: Secondary | ICD-10-CM | POA: Insufficient documentation

## 2021-05-05 DIAGNOSIS — Z171 Estrogen receptor negative status [ER-]: Secondary | ICD-10-CM | POA: Insufficient documentation

## 2021-05-05 NOTE — Progress Notes (Signed)
?[place]   ?Telephone:(336) [phone] [phone]   ?Clinic Follow up Note  ? ?Patient Care Team: ?[name], MD as PCP - General (Internal Medicine) ?Mauro Kaufmann, RN as Oncology Nurse Navigator ?Rockwell Germany, RN as Oncology Nurse Navigator ? ?Date of Service:  05/05/2021 ? ?CHIEF COMPLAINT: f/u of right breast DCIS ? ?CURRENT THERAPY:  ?Adjuvant radiation, 04/11/21 - 05/09/21 ? ?ASSESSMENT & PLAN:  ?Tanya Turner is a 71 y.o. post-hysterectomy female with  ? ?1. Right breast DCIS, grade 2, ER-/PR- ?-found on screening mammogram. Biopsy 12/02/20 confirmed DCIS. ?-right lumpectomy on 03/03/21 by Dr. Brantley Stage showed 0.5 cm DCIS, margins negative. ?-she is currently receiving adjuvant radiation under Dr. Isidore Moos, started 04/11/21 and scheduled to finish 05/09/21. ?-Given her negative ER and PR, I do not recommend chemoprevention with antiestrogen therapy. ?-she is clinically doing very well. I feel comfortable releasing her from f/u with me. She will see Dr. Brantley Stage later this year. ?-We discussed breast cancer surveillance, due to her dense breast tissue and history of high-grade DCIS, I recommend annual breast MRI in addition to mammogram, she agrees. ?  ?  ?PLAN:  ?-f/u open ? ? ?No problem-specific Assessment & Plan notes found for this encounter. ? ? ?SUMMARY OF ONCOLOGIC HISTORY: ?Oncology History Overview Note  ?Cancer Staging ?Ductal carcinoma in situ (DCIS) of right breast ?Staging form: Breast, AJCC 8th Edition ?- Clinical stage from 12/02/2020: Stage 0 (cTis (DCIS), cN0, cM0, G2) - Signed by Truitt Merle, MD on 12/07/2020 ? ?  ?Ductal carcinoma in situ (DCIS) of right breast  ?11/24/2020 Mammogram  ? EXAM: ?DIGITAL DIAGNOSTIC UNILATERAL RIGHT MAMMOGRAM ? ?IMPRESSION: ?1. Three separate 0.3 cm groups of indeterminate calcifications ?within the UPPER RIGHT breast. Tissue sampling is recommended. ?  ?12/02/2020 Cancer Staging  ? Staging form: Breast, AJCC 8th Edition ?- Clinical stage from  12/02/2020: Stage 0 (cTis (DCIS), cN0, cM0, G2) - Signed by Truitt Merle, MD on 12/07/2020 ?Stage prefix: Initial diagnosis ?Histologic grading system: 3 grade system ? ?  ?12/02/2020 Pathology Results  ? Diagnosis ?1. Breast, right, needle core biopsy, superior posterior, coil clip ?- DUCTAL CARCINOMA IN SITU WITH CALCIFICATIONS AND NECROSIS. ?- SEE MICROSCOPIC DESCRIPTION. ?2. Breast, right, needle core biopsy, upper inner, posterior, ribbon clip ?- DUCTAL CARCINOMA IN SITU WITH CALCIFICATIONS AND NECROSIS. ?- SEE MICROSCOPIC DESCRIPTION ?Microscopic Comment ?1. -2. The DCIS has intermediate nuclear grade. ? ?1. PROGNOSTIC INDICATORS ?Results: ?Estrogen Receptor: <1%, NEGATIVE ?Progesterone Receptor: 0%, NEGATIVE ?  ?12/06/2020 Initial Diagnosis  ? Ductal carcinoma in situ (DCIS) of right breast ?  ?12/08/2020 Genetic Testing  ? [facility] CustomNext Panel was Negative. 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 Report date is 12/30/2020. ? ?The CustomNext-Cancer+RNAinsight panel offered by [employer] includes sequencing and rearrangement analysis for the following 47 genes:  APC, ATM, AXIN2, BARD1, BMPR1A, BRCA1, BRCA2, BRIP1, CDH1, CDK4, CDKN2A, CHEK2, CTNNA1, DICER1, EPCAM, GREM1, HOXB13, KIT, MEN1, MLH1, MSH2, MSH3, MSH6, MUTYH, NBN, NF1, NTHL1, PALB2, PDGFRA, PMS2, POLD1, POLE, PTEN, RAD50, RAD51C, RAD51D, SDHA, SDHB, SDHC, SDHD, SMAD4, SMARCA4, STK11, TP53, TSC1, TSC2, and VHL.  RNA data is routinely analyzed for use in variant interpretation for all genes. ?  ?03/03/2021 Definitive Surgery  ? FINAL MICROSCOPIC DIAGNOSIS:  ? ?A. BREAST, RIGHT, LUMPECTOMY:  ?- Ductal carcinoma in situ with necrosis, 0.5 cm.  ?- All surgical margins negative for DCIS.  ?- Fibrocystic changes with calcifications.  ?- Biopsy site and biopsy clip.  ?-  See oncology table.  ?  ? ? ? ?INTERVAL HISTORY:  ?Tanya Turner is here for a follow up of DCIS. She was last seen by me on 12/08/20 in consultation. She  presents to the clinic alone. ?She reports she is doing well overall. She reports some burning from radiation. She also notes some stiffness in her right shoulder. ?  ?All other systems were reviewed with the patient and are negative. ? ?MEDICAL HISTORY:  ?Past Medical History:  ?Diagnosis Date  ? Anxiety   ? Blood transfusion without reported diagnosis 03/31/1998  ? Breast cancer ([place]) 11/24/2020  ? Hypertension 12/30/1997  ? ? ?SURGICAL HISTORY: ?Past Surgical History:  ?Procedure Laterality Date  ? ABDOMINAL HYSTERECTOMY  03/31/1998  ? BREAST LUMPECTOMY WITH RADIOACTIVE SEED LOCALIZATION Right 03/03/2021  ? Procedure: RIGHT BREAST LUMPECTOMY x2 (BRACKETED)  WITH RADIOACTIVE SEED LOCALIZATION;  Surgeon: Erroll Luna, MD;  Location: [facility];  Service: General;  Laterality: Right;  ? TUBAL LIGATION  08/31/1984  ? ? ?I have reviewed the social history and family history with the patient and they are unchanged from previous note. ? ?ALLERGIES:  has No Known Allergies. ? ?MEDICATIONS:  ?Current Outpatient Medications  ?Medication Sig Dispense Refill  ? aspirin EC 81 MG tablet Take 81 mg by mouth daily. Swallow whole.    ? atorvastatin (LIPITOR) 40 MG tablet Take 40 mg by mouth daily.    ? Calcium Citrate (CITRACAL PO) Take 1 tablet by mouth daily.    ? carvedilol (COREG CR) 20 MG 24 hr capsule Take 20 mg by mouth daily.    ? chlorthalidone (HYGROTON) 25 MG tablet Take 25 mg by mouth daily.    ? Cholecalciferol (VITAMIN D3) 10 MCG (400 UNIT) CAPS Take 400 Units by mouth daily.    ? Lysine 1000 MG TABS Take 1,000 mg by mouth daily.    ? nisoldipine (SULAR) 17 MG 24 hr tablet Take 17 mg by mouth daily.    ? olmesartan (BENICAR) 40 MG tablet Take 40 mg by mouth daily.    ? ?No current facility-administered medications for this visit.  ? ? ?PHYSICAL EXAMINATION: ?ECOG PERFORMANCE STATUS: 0 - Asymptomatic ? ?Vitals:  ? 05/05/21 1029  ?BP: (!) 171/61  ?Pulse: 69  ?Resp: 18  ?Temp: 98.2 ?F (36.8 ?C)  ?SpO2: 100%  ? ?Wt Readings  from Last 3 Encounters:  ?05/05/21 192 lb 1.6 oz (87.1 kg)  ?04/01/21 195 lb 6 oz (88.6 kg)  ?03/03/21 190 lb (86.2 kg)  ?  ? ?GENERAL:alert, no distress and comfortable ?SKIN: skin color normal, no rashes or significant lesions ?EYES: normal, Conjunctiva are pink and non-injected, sclera clear  ?NEURO: alert & oriented x 3 with fluent speech ? ?LABORATORY DATA:  ?I have reviewed the data as listed ? ?  Latest Ref Rng & Units 02/28/2021  ? 11:15 AM 12/08/2020  ?  8:24 AM  ?CBC  ?WBC 4.0 - 10.5 K/uL 6.2   10.0    ?Hemoglobin 12.0 - 15.0 g/dL 12.2   12.9    ?Hematocrit 36.0 - 46.0 % 38.9   39.8    ?Platelets 150 - 400 K/uL 319   318    ? ? ? ? ?  Latest Ref Rng & Units 02/28/2021  ? 11:15 AM 12/08/2020  ?  8:24 AM  ?CMP  ?Glucose 70 - 99 mg/dL 104   103    ?BUN 8 - 23 mg/dL 14   12    ?Creatinine 0.44 - 1.00 mg/dL  0.65   0.68    ?Sodium 135 - 145 mmol/L 141   142    ?Potassium 3.5 - 5.1 mmol/L 3.8   3.6    ?Chloride 98 - 111 mmol/L 103   106    ?CO2 22 - 32 mmol/L 27   25    ?Calcium 8.9 - 10.3 mg/dL 9.9   9.4    ?Total Protein 6.5 - 8.1 g/dL  7.9    ?Total Bilirubin 0.3 - 1.2 mg/dL  0.3    ?Alkaline Phos 38 - 126 U/L  76    ?AST 15 - 41 U/L  14    ?ALT 0 - 44 U/L  11    ? ? ? ? ?RADIOGRAPHIC STUDIES: ?I have personally reviewed the radiological images as listed and agreed with the findings in the report. ?No results found.  ? ? ?No orders of the defined types were placed in this encounter. ? ?All questions were answered. The patient knows to call the clinic with any problems, questions or concerns. No barriers to learning was detected. ?The total time spent in the appointment was 25 minutes. ? ?  ? Truitt Merle, MD ?05/05/2021  ? ?I, Wilburn Mylar, am acting as scribe for Truitt Merle, MD.  ? ?I have reviewed the above documentation for accuracy and completeness, and I agree with the above. ?  ? ? ?

## 2021-05-06 ENCOUNTER — Ambulatory Visit: Payer: Medicare Other

## 2021-05-06 ENCOUNTER — Telehealth: Payer: Self-pay | Admitting: Nurse Practitioner

## 2021-05-06 ENCOUNTER — Other Ambulatory Visit: Payer: Self-pay

## 2021-05-06 ENCOUNTER — Ambulatory Visit
Admission: RE | Admit: 2021-05-06 | Discharge: 2021-05-06 | Disposition: A | Discharge status: Home / Self Care | Payer: Medicare Other | Source: Ambulatory Visit | Attending: Radiation Oncology | Admitting: Radiation Oncology

## 2021-05-06 ENCOUNTER — Encounter: Payer: Self-pay | Admitting: *Deleted

## 2021-05-06 DIAGNOSIS — D0511 Intraductal carcinoma in situ of right breast: Secondary | ICD-10-CM

## 2021-05-06 DIAGNOSIS — Z510 Encounter for antineoplastic radiation therapy: Secondary | ICD-10-CM | POA: Diagnosis not present

## 2021-05-06 NOTE — Telephone Encounter (Signed)
Scheduled appt per 4/7 sch msg. Pt aware.  

## 2021-05-09 ENCOUNTER — Other Ambulatory Visit: Payer: Self-pay

## 2021-05-09 ENCOUNTER — Encounter: Payer: Self-pay | Admitting: Radiation Oncology

## 2021-05-09 ENCOUNTER — Ambulatory Visit: Payer: Medicare Other

## 2021-05-09 ENCOUNTER — Ambulatory Visit
Admission: RE | Admit: 2021-05-09 | Discharge: 2021-05-09 | Disposition: A | Discharge status: Home / Self Care | Payer: Medicare Other | Source: Ambulatory Visit | Attending: Radiation Oncology | Admitting: Radiation Oncology

## 2021-05-09 DIAGNOSIS — Z510 Encounter for antineoplastic radiation therapy: Secondary | ICD-10-CM | POA: Diagnosis not present

## 2021-05-10 ENCOUNTER — Ambulatory Visit: Payer: Medicare Other

## 2021-06-09 ENCOUNTER — Telehealth: Payer: Self-pay

## 2021-06-09 NOTE — Telephone Encounter (Signed)
I called the patient today about her upcoming follow-up appointment in radiation oncology.  ? ?Given the state of the COVID-19 pandemic, concerning case numbers in our community, and guidance from [facility], I offered a phone assessment with the patient to determine if coming to the clinic was necessary. She accepted. ? ?The patient denies any symptomatic concerns. She reports her fatigue is improving and she is feeling closer to her baseline. She denies any lingering pain or swelling to her breast. She reports occasional discomfort to her right shoulder, but states she is diligent about doing her PT range of motion exercises. Specifically, she reports good healing of her skin in the radiation fields. She did experience some peeling in the treatment field a couple weeks after completing radiation.  Mild hyperpigmentation remains to her axilla and under her breast fold, but skin is otherwise intact. I recommended that she continue skin care by applying oil or lotion with vitamin E to the skin in the radiation fields, BID, for 2 more months.   ? ?Continue follow-up with medical oncology - follow-up is scheduled on 09/01/2021 with Lacie Burton-NP in the [facility].  I explained that yearly mammograms are important for patients with intact breast tissue, and physical exams are important after mastectomy for patients that cannot undergo mammography. ? ?I encouraged her to call if she had further questions or concerns about her healing. Otherwise, she will follow-up PRN in radiation oncology. Patient is pleased with this plan, and expressed gratitude and appreciation for the care she received from Dr. Isidore Moos and her team. We will cancel her upcoming follow-up to reduce the risk of COVID-19 transmission. ? ? ?

## 2021-06-10 ENCOUNTER — Ambulatory Visit: Payer: Medicare Other | Admitting: Radiation Oncology

## 2021-07-05 NOTE — Progress Notes (Signed)
                                                                                                                                                             Patient Name: Tanya Turner MRN: [id number] DOB: 1950-04-13 Referring Physician: [name] (Profile Not Attached) Date of Service: 05/09/2021 [facility], [place]                                                        End Of Treatment Note  Diagnoses: D05.11-Intraductal carcinoma in situ of right breast  Cancer Staging:  Cancer Staging  Ductal carcinoma in situ (DCIS) of right breast Staging form: Breast, AJCC 8th Edition - Clinical stage from 12/02/2020: Stage 0 (cTis (DCIS), cN0, cM0, G2) - Signed by Truitt Merle, MD on 12/07/2020 Stage prefix: Initial diagnosis Histologic grading system: 3 grade system  pTis pNX  Intent: Curative  Radiation Treatment Dates: 04/11/2021 through 05/09/2021 Site Technique Total Dose (Gy) Dose per Fx (Gy) Completed Fx Beam Energies  Breast, Right: Breast_R 3D 40.05/40.05 2.67 15/15 6XFFF  Breast, Right: Breast_R_Bst 3D 10/10 2 5/5 6X, 10X   Narrative: The patient tolerated radiation therapy relatively well.   Plan: The patient will follow-up with radiation oncology in 34moand/or prn .  -----------------------------------  SEppie Gibson MD

## 2021-08-30 ENCOUNTER — Encounter: Payer: Self-pay | Admitting: *Deleted

## 2021-09-01 ENCOUNTER — Other Ambulatory Visit: Payer: Self-pay

## 2021-09-01 ENCOUNTER — Encounter: Payer: Self-pay | Admitting: Nurse Practitioner

## 2021-09-01 ENCOUNTER — Inpatient Hospital Stay: Payer: Medicare Other | Attending: Nurse Practitioner | Admitting: Nurse Practitioner

## 2021-09-01 VITALS — BP 193/75 | HR 76 | Temp 98.3°F | Resp 17 | Ht 62.0 in | Wt 195.2 lb

## 2021-09-01 DIAGNOSIS — Z923 Personal history of irradiation: Secondary | ICD-10-CM | POA: Insufficient documentation

## 2021-09-01 DIAGNOSIS — D0511 Intraductal carcinoma in situ of right breast: Secondary | ICD-10-CM | POA: Insufficient documentation

## 2021-09-01 NOTE — Progress Notes (Signed)
CLINIC:  Survivorship   Patient Care Team: [name], MD as PCP - General (Internal Medicine) Mauro Kaufmann, RN as Oncology Nurse Navigator Rockwell Germany, RN as Oncology Nurse Navigator Erroll Luna, MD as Consulting Physician (General Surgery) Truitt Merle, MD as Consulting Physician (Hematology) Eppie Gibson, MD as Attending Physician (Radiation Oncology) Alla Feeling, NP as Nurse Practitioner (Nurse Practitioner)   REASON FOR VISIT:  Routine follow-up post-treatment for a recent history of breast cancer.  BRIEF ONCOLOGIC HISTORY:  Oncology History Overview Note  Cancer Staging Ductal carcinoma in situ (DCIS) of right breast Staging form: Breast, AJCC 8th Edition - Clinical stage from 12/02/2020: Stage 0 (cTis (DCIS), cN0, cM0, G2) - Signed by Truitt Merle, MD on 12/07/2020    Ductal carcinoma in situ (DCIS) of right breast  11/24/2020 Mammogram   EXAM: DIGITAL DIAGNOSTIC UNILATERAL RIGHT MAMMOGRAM  IMPRESSION: 1. Three separate 0.3 cm groups of indeterminate calcifications within the UPPER RIGHT breast. Tissue sampling is recommended.   12/02/2020 Cancer Staging   Staging form: Breast, AJCC 8th Edition - Clinical stage from 12/02/2020: Stage 0 (cTis (DCIS), cN0, cM0, G2) - Signed by Truitt Merle, MD on 12/07/2020 Stage prefix: Initial diagnosis Histologic grading system: 3 grade system   12/02/2020 Pathology Results   Diagnosis 1. Breast, right, needle core biopsy, superior posterior, coil clip - DUCTAL CARCINOMA IN SITU WITH CALCIFICATIONS AND NECROSIS. - SEE MICROSCOPIC DESCRIPTION. 2. Breast, right, needle core biopsy, upper inner, posterior, ribbon clip - DUCTAL CARCINOMA IN SITU WITH CALCIFICATIONS AND NECROSIS. - SEE MICROSCOPIC DESCRIPTION Microscopic Comment 1. -2. The DCIS has intermediate nuclear grade.  1. PROGNOSTIC INDICATORS Results: Estrogen Receptor: <1%, NEGATIVE Progesterone Receptor: 0%, NEGATIVE   12/06/2020 Initial Diagnosis   Ductal  carcinoma in situ (DCIS) of right breast   12/08/2020 Genetic Testing   [employer] CustomNext Panel was Negative. 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 Report date is 12/30/2020.  The CustomNext-Cancer+RNAinsight panel offered by [employer] [facility] includes sequencing and rearrangement analysis for the following 47 genes:  APC, ATM, AXIN2, BARD1, BMPR1A, BRCA1, BRCA2, BRIP1, CDH1, CDK4, CDKN2A, CHEK2, CTNNA1, DICER1, EPCAM, GREM1, HOXB13, KIT, MEN1, MLH1, MSH2, MSH3, MSH6, MUTYH, NBN, NF1, NTHL1, PALB2, PDGFRA, PMS2, POLD1, POLE, PTEN, RAD50, RAD51C, RAD51D, SDHA, SDHB, SDHC, SDHD, SMAD4, SMARCA4, STK11, TP53, TSC1, TSC2, and VHL.  RNA data is routinely analyzed for use in variant interpretation for all genes.   03/03/2021 Definitive Surgery   FINAL MICROSCOPIC DIAGNOSIS:   A. BREAST, RIGHT, LUMPECTOMY:  - Ductal carcinoma in situ with necrosis, 0.5 cm.  - All surgical margins negative for DCIS.  - Fibrocystic changes with calcifications.  - Biopsy site and biopsy clip.  - See oncology table.    04/11/2021 - 05/09/2021 Radiation Therapy   Radiation Treatment Dates: 04/11/2021 through 05/09/2021 Site Technique Total Dose (Gy) Dose per Fx (Gy) Completed Fx Beam Energies  Breast, Right: Breast_R 3D 40.05/40.05 2.67 15/15 6XFFF  Breast, Right: Breast_R_Bst 3D 10/10 2 5/5 6X, 10X      09/01/2021 Survivorship   SCP delivered by Cira Rue, NP     INTERVAL HISTORY:  Tanya Turner presents to the [facility] today for our initial meeting to review her survivorship care plan detailing her treatment course for breast cancer, as well as monitoring long-term side effects of that treatment, education regarding health maintenance, screening, and overall wellness and health promotion.     Overall, Tanya Turner reports feeling well.  She is still tired  but remains functional and active.  Her skin is healing, not raw anymore just discolored.  She continues to use topical gel.   She has mild intermittent breast pains, nothing persistent or intrusive.  She will see Dr. Brantley Stage on Monday.  She is open to screening MRI if it can be open.  She recently found out her maternal aunt had uterine cancer.   REVIEW OF SYSTEMS:  Review of Systems - Oncology Breast: Denies any new nodularity, masses, tenderness, nipple changes, or nipple discharge.      ONCOLOGY TREATMENT TEAM:  1. Surgeon:  Dr. Brantley Stage at [facility] 2. Medical Oncologist: Dr. Burr Medico 3. Radiation Oncologist: Dr. Isidore Moos    PAST MEDICAL/SURGICAL HISTORY:  Past Medical History:  Diagnosis Date   Anxiety    Blood transfusion without reported diagnosis 03/31/1998   Breast cancer ([place]) 11/24/2020   Hypertension 12/30/1997   Past Surgical History:  Procedure Laterality Date   ABDOMINAL HYSTERECTOMY  03/31/1998   BREAST LUMPECTOMY WITH RADIOACTIVE SEED LOCALIZATION Right 03/03/2021   Procedure: RIGHT BREAST LUMPECTOMY x2 (BRACKETED)  WITH RADIOACTIVE SEED LOCALIZATION;  Surgeon: Erroll Luna, MD;  Location: [place];  Service: General;  Laterality: Right;   TUBAL LIGATION  08/31/1984     ALLERGIES:  No Known Allergies   CURRENT MEDICATIONS:  Outpatient Encounter Medications as of 09/01/2021  Medication Sig   aspirin EC 81 MG tablet Take 81 mg by mouth daily. Swallow whole.   atorvastatin (LIPITOR) 40 MG tablet Take 40 mg by mouth daily.   Calcium Citrate (CITRACAL PO) Take 1 tablet by mouth daily.   carvedilol (COREG CR) 20 MG 24 hr capsule Take 20 mg by mouth daily.   chlorthalidone (HYGROTON) 25 MG tablet Take 25 mg by mouth daily.   Cholecalciferol (VITAMIN D3) 10 MCG (400 UNIT) CAPS Take 400 Units by mouth daily.   Lysine 1000 MG TABS Take 1,000 mg by mouth daily.   nisoldipine (SULAR) 17 MG 24 hr tablet Take 17 mg by mouth daily.   olmesartan (BENICAR) 40 MG tablet Take 40 mg by mouth daily.   No facility-administered encounter medications on file as of 09/01/2021.     ONCOLOGIC  FAMILY HISTORY:  Family History  Problem Relation Age of Onset   Alcohol abuse Father    Diabetes Brother    Hypertension Brother    Kidney disease Brother    Stroke Brother    Prostate cancer Brother 31   Prostate cancer Maternal Uncle 2     GENETIC COUNSELING/TESTING: Yes, negative  SOCIAL HISTORY:  Tanya Turner is married, lives with her spouse in [place].  She denies any current or history of tobacco, alcohol, or illicit drug use.  Her parents were smokers.   PHYSICAL EXAMINATION:  Vital Signs:   Vitals:   09/01/21 1223  BP: (!) 193/75  Pulse: 76  Resp: 17  Temp: 98.3 F (36.8 C)  SpO2: 97%   Filed Weights   09/01/21 1223  Weight: 195 lb 3.2 oz (88.5 kg)   General: Well-nourished, well-appearing female in no acute distress.   HEENT: Sclerae anicteric.  Respiratory: breathing non-labored.  Neuro: No focal deficits. Steady gait.  Psych: Mood and affect normal and appropriate for situation.  Breast exam: Deferred, will see surgeon 8/7 for exam  LABORATORY DATA:  None for this visit.  DIAGNOSTIC IMAGING:  None for this visit.      ASSESSMENT AND PLAN:  Ms.. Turner is a pleasant 71 y.o. female with Stage  0 ductal carcinoma in situ of the right breast, ER negative/PR negative, diagnosed in 11/2020 treated with lumpectomy and adjuvant radiation therapy. She presents to the Survivorship Clinic for our initial meeting and routine follow-up post-completion of treatment for breast cancer.    1. Stage 0 right breast cancer/DCIS:  Tanya Turner has recovered well from definitive treatment for breast cancer.  Due to ER/PR negative she is not on antiestrogen therapy.  She will follow-up with medical oncology as needed.  We reviewed screening recommendations including annual mammogram with staggered MRI every 1-2 years for additional screening, she is interested.  I will ask Dr. Brantley Stage to order.  Today, a comprehensive survivorship care plan and treatment  summary was reviewed with the patient today detailing her breast cancer diagnosis, treatment course, potential late/long-term effects of treatment, appropriate follow-up care with recommendations for the future, and patient education resources.  A copy of this summary, along with a letter will be sent to the patient's primary care provider via mail/fax/In Basket message after today's visit.    2. Bone health:  Given Tanya Turner's postmenopausal status, she is at risk for bone demineralization.  Her last DEXA scan was normal in 2011, she is eligible for screening.  I will defer to PCP.  In the meantime, she takes calcium and vitamin D.  Continue weight-bearing activities.    3. Cancer screening:  Due to Tanya Turner's history and her age, she should receive screening for skin cancers and colon cancers.  She is s/p hysterectomy.  Lung cancer screening per PCP.    4. Health maintenance and wellness promotion: Tanya Turner was encouraged to consume 5-7 servings of fruits and vegetables per day. She was also encouraged to engage in moderate to vigorous exercise for 30 minutes per day most days of the week. She was instructed to limit her alcohol consumption and continue to abstain from tobacco use.     5. Support services/counseling: It is not uncommon for this period of the patient's cancer care trajectory to be one of many emotions and stressors. She was offered support today through active listening and expressive supportive counseling.  She was given information regarding our available services and encouraged to contact me with any questions or for help enrolling in any of our support group/programs.    Dispo:   -Mammogram due in November, ordered today -Follow up with surgery 8/7 as scheduled -Follow-up with radiation as scheduled -Follow-up with medical oncology open, as needed in the future -I recommend to add screening breast MRIs staggered from mammograms every 1-2 years, patient agrees. Will ask PCP or  surgery to manage given we do not plan to follow patient regularly -She is welcome to return back to the Survivorship Clinic at any time; no additional follow-up needed at this time.  -Consider referral back to survivorship as a long-term survivor for continued surveillance  Orders Placed This Encounter  Procedures   MM DIAG BREAST TOMO BILATERAL    Standing Status:   Future    Standing Expiration Date:   09/02/2022    Order Specific Question:   Reason for Exam (SYMPTOM  OR DIAGNOSIS REQUIRED)    Answer:   R breast DCIS 11/22 s/p lumpectomy and radiation    Order Specific Question:   Preferred imaging location?    Answer:   [facility]     A total of (30) minutes of face-to-face time was spent with this patient with greater than 50% of that time in counseling and care-coordination.  Cira Rue, NP Survivorship Program [facility] [phone]   Note: PRIMARY CARE PROVIDER [name], [place] [phone]

## 2022-02-20 ENCOUNTER — Other Ambulatory Visit: Payer: Self-pay | Admitting: Internal Medicine

## 2022-02-20 DIAGNOSIS — Z1231 Encounter for screening mammogram for malignant neoplasm of breast: Secondary | ICD-10-CM

## 2022-02-23 ENCOUNTER — Ambulatory Visit
Admission: RE | Admit: 2022-02-23 | Discharge: 2022-02-23 | Disposition: A | Discharge status: Home / Self Care | Payer: Medicare Other | Source: Ambulatory Visit | Attending: Internal Medicine | Admitting: Internal Medicine

## 2022-02-23 DIAGNOSIS — Z1231 Encounter for screening mammogram for malignant neoplasm of breast: Secondary | ICD-10-CM

## 2022-03-01 ENCOUNTER — Ambulatory Visit
Admission: RE | Admit: 2022-03-01 | Discharge: 2022-03-01 | Disposition: A | Discharge status: Home / Self Care | Payer: Medicare Other | Source: Ambulatory Visit | Attending: Nurse Practitioner | Admitting: Nurse Practitioner

## 2022-03-01 DIAGNOSIS — D0511 Intraductal carcinoma in situ of right breast: Secondary | ICD-10-CM

## 2022-03-01 HISTORY — DX: Personal history of irradiation: Z92.3

## 2022-11-07 ENCOUNTER — Other Ambulatory Visit: Payer: Self-pay | Admitting: Internal Medicine

## 2022-11-07 DIAGNOSIS — Z9889 Other specified postprocedural states: Secondary | ICD-10-CM

## 2023-03-07 ENCOUNTER — Ambulatory Visit
Admission: RE | Admit: 2023-03-07 | Discharge: 2023-03-07 | Disposition: A | Discharge status: Home / Self Care | Payer: Medicare Other | Source: Ambulatory Visit | Attending: Internal Medicine | Admitting: Internal Medicine

## 2023-03-07 DIAGNOSIS — Z9889 Other specified postprocedural states: Secondary | ICD-10-CM

## 2023-10-07 IMAGING — MG MM BREAST BX W/ LOC DEV EA AD LESION IMAG BX SPEC STEREO GUIDE*R
7 of 14 series · 7 of 38 positions shown · non-contrast
Comparison: Previous exams.
COMPARISON: Previous exams.

Addendum:
CLINICAL DATA: 69-year-old female presenting for stereotactic
biopsy of 2 groups of calcifications in the right breast.

EXAM:
RIGHT BREAST STEREOTACTIC CORE NEEDLE BIOPSY

[R (1 of 7)]
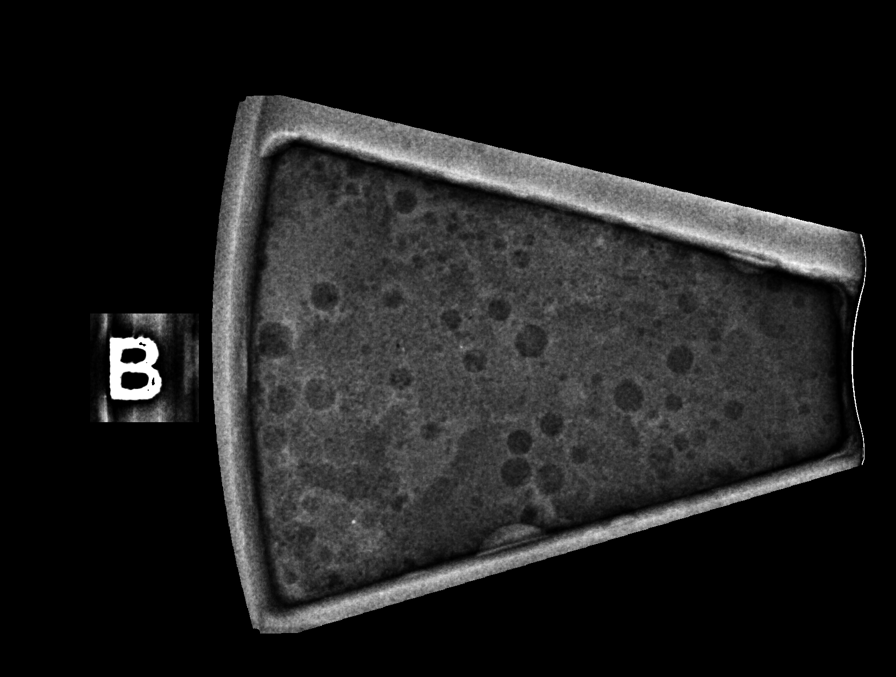

[R (2 of 7)]
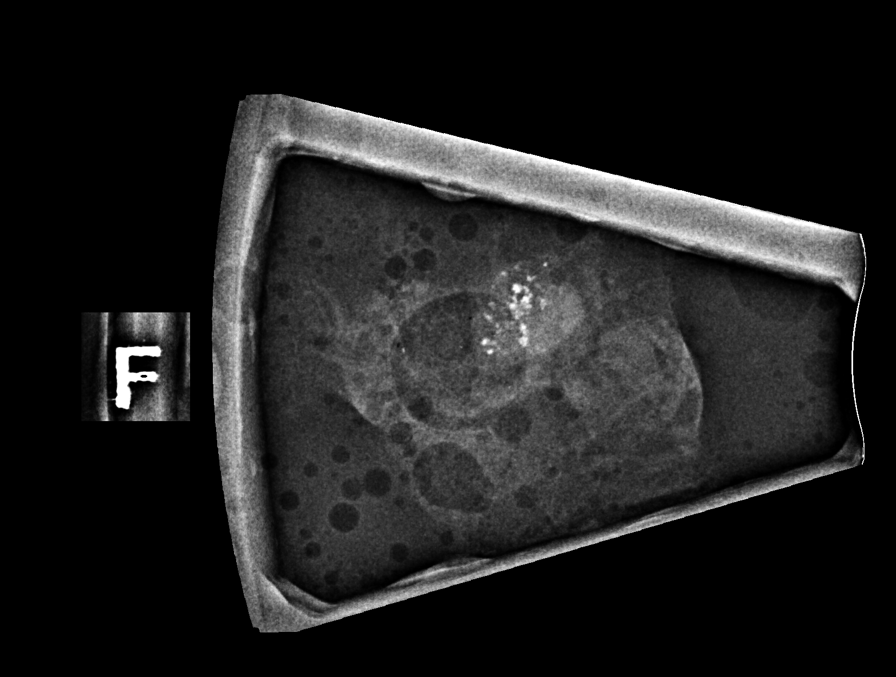

[R (3 of 7)]
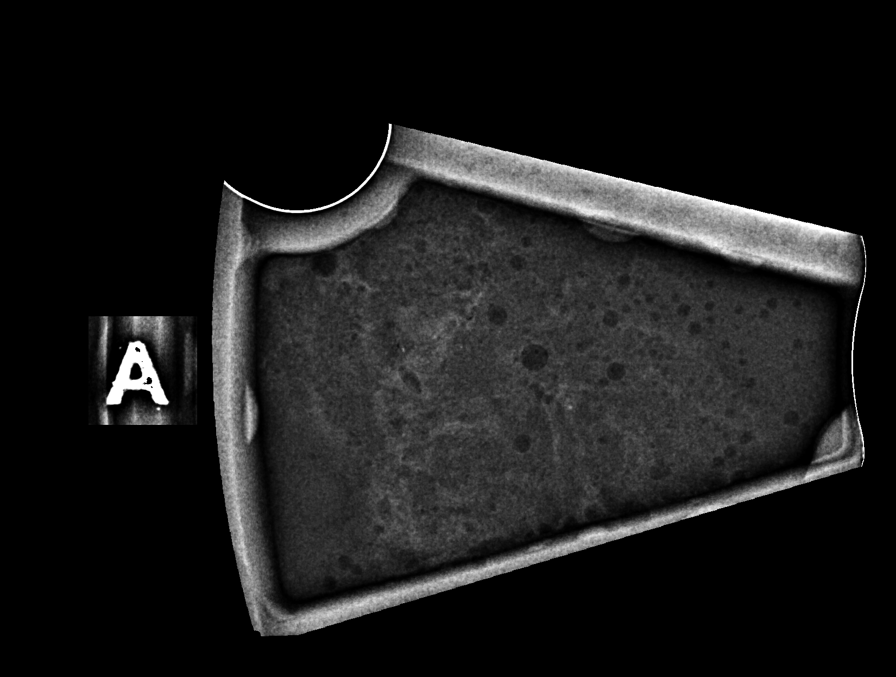

[R (4 of 7)]
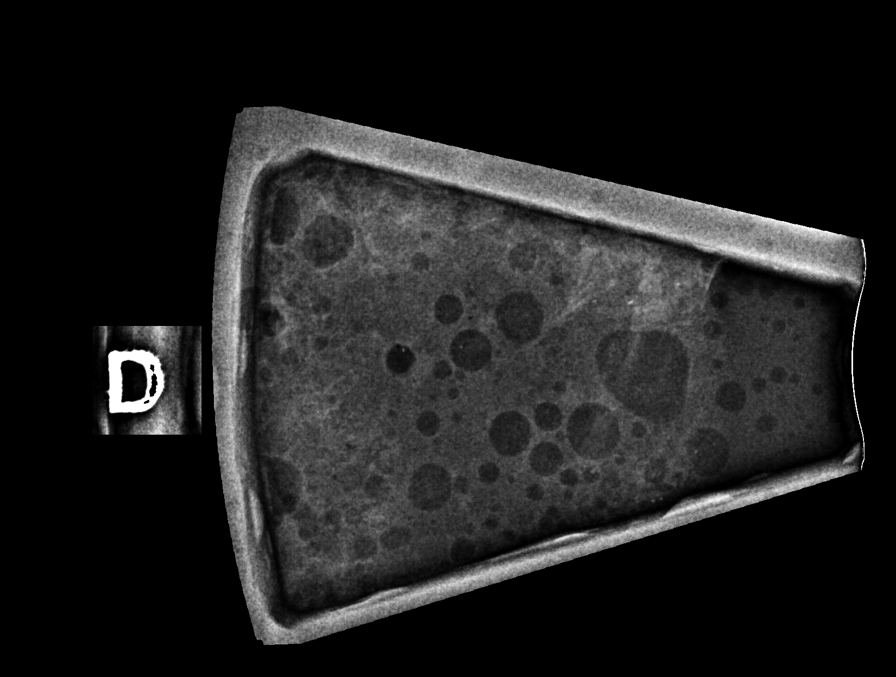

[R (5 of 7)]
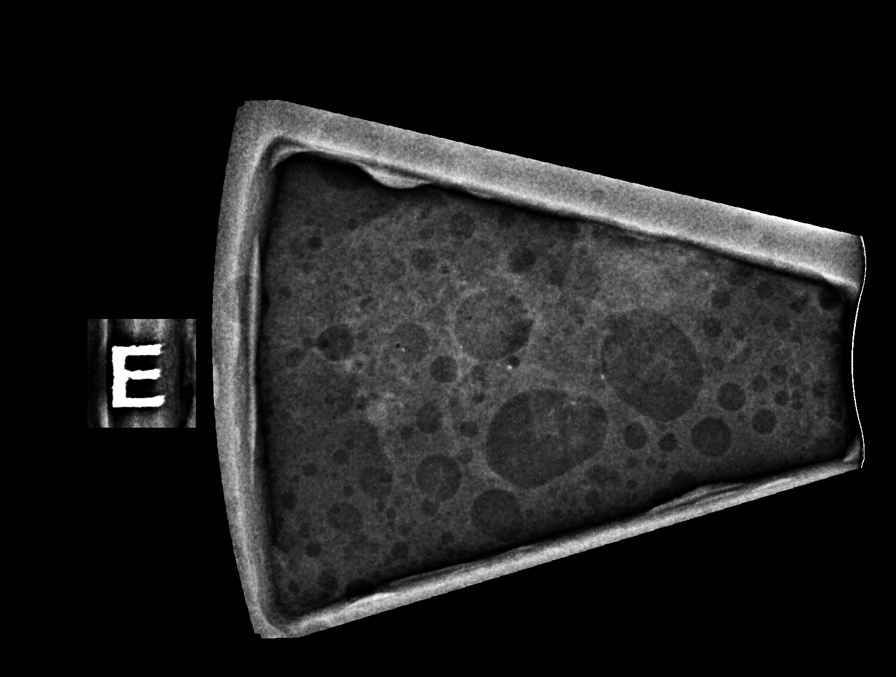

[R (6 of 7)]
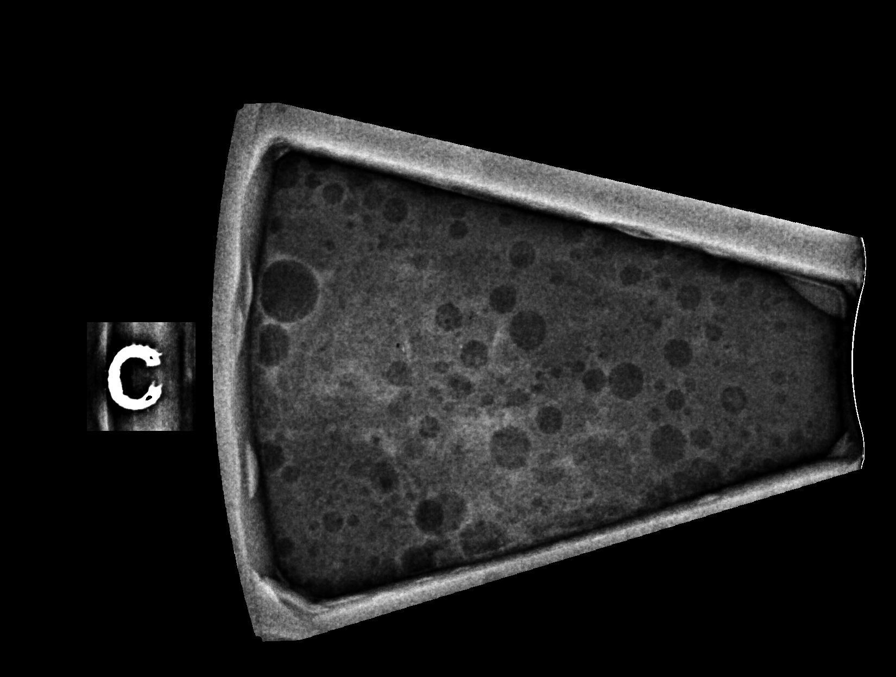

[R (7 of 7)]
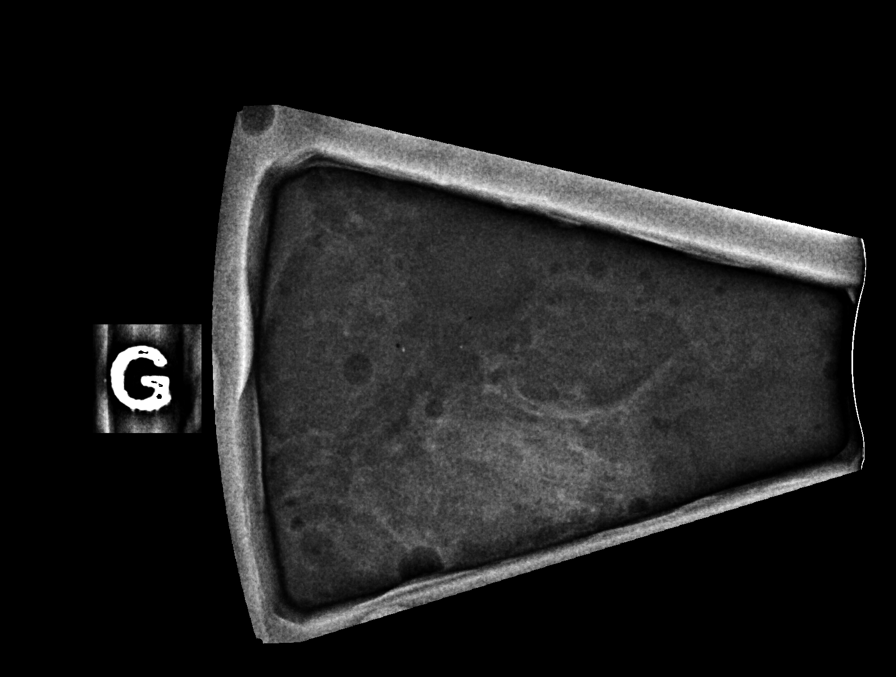

[7 of 38 positions shown; findings below may reference images not displayed]



Using sterile technique and 1% Lidocaine as local anesthetic, under
stereotactic guidance, a 9 gauge vacuum assisted device was used to
perform core needle biopsy of calcifications in the superior
posterior right breast using a superior approach. Specimen
radiograph was performed showing calcifications in multiple core
samples. Specimens with calcifications are identified for pathology.

Lesion quadrant: Upper outer quadrant

At the conclusion of the procedure, a coil shaped shaped tissue
marker clip was deployed into the biopsy cavity.

--------------------------------------------------------------

Using sterile technique and 1% Lidocaine as local anesthetic, under
stereotactic guidance, a 9 gauge vacuum assisted device was used to
perform core needle biopsy of calcifications in the upper slightly
inner posterior right breast using a superior approach. Specimen
radiograph was performed showing calcifications in multiple core
samples. Specimens with calcifications are identified for pathology.

Lesion quadrant: Upper inner quadrant

At the conclusion of the procedure, a ribbon shaped shaped tissue
marker clip was deployed into the biopsy cavity. Follow-up 2-view
mammogram was performed and dictated separately.
IMPRESSION: 1. Stereotactic-guided biopsy of calcifications in the superior
posterior right breast. No apparent complications.

2. Stereotactic guided biopsy of calcifications in the upper
slightly inner posterior right breast. No apparent complications.

ADDENDUM:
Pathology revealed INTERMEDIATE GRADE DUCTAL CARCINOMA IN SITU WITH
CALCIFICATIONS AND NECROSIS of the RIGHT breast, superior posterior,
(coil clip). This was found to be concordant by Dr. Naga
Lindahl.

Pathology revealed INTERMEDIATE GRADE DUCTAL CARCINOMA IN SITU WITH
CALCIFICATIONS AND NECROSIS of the RIGHT breast, upper inner,
posterior, (ribbon clip). This was found to be concordant by Dr.
Donghyeon Mike.

Pathology results were discussed with the patient by telephone. The
patient reported doing well after the biopsies with tenderness at
the sites. Post biopsy instructions and care were reviewed and
questions were answered. The patient was encouraged to call The
direct phone number was provided.

The patient was referred to [REDACTED]
[REDACTED] at [REDACTED] on
December 08, 2020.

Pathology results reported by Deeqa Rayaan Adlaho, RN on 12/03/2020.



Using sterile technique and 1% Lidocaine as local anesthetic, under
stereotactic guidance, a 9 gauge vacuum assisted device was used to
perform core needle biopsy of calcifications in the superior
posterior right breast using a superior approach. Specimen
radiograph was performed showing calcifications in multiple core
samples. Specimens with calcifications are identified for pathology.

Lesion quadrant: Upper outer quadrant

At the conclusion of the procedure, a coil shaped shaped tissue
marker clip was deployed into the biopsy cavity.

--------------------------------------------------------------

Using sterile technique and 1% Lidocaine as local anesthetic, under
stereotactic guidance, a 9 gauge vacuum assisted device was used to
perform core needle biopsy of calcifications in the upper slightly
inner posterior right breast using a superior approach. Specimen
radiograph was performed showing calcifications in multiple core
samples. Specimens with calcifications are identified for pathology.

Lesion quadrant: Upper inner quadrant

At the conclusion of the procedure, a ribbon shaped shaped tissue
marker clip was deployed into the biopsy cavity. Follow-up 2-view
mammogram was performed and dictated separately.
IMPRESSION: 1. Stereotactic-guided biopsy of calcifications in the superior
posterior right breast. No apparent complications.

2. Stereotactic guided biopsy of calcifications in the upper
slightly inner posterior right breast. No apparent complications.

## 2024-02-04 ENCOUNTER — Other Ambulatory Visit: Payer: Self-pay | Admitting: Internal Medicine

## 2024-02-04 DIAGNOSIS — Z853 Personal history of malignant neoplasm of breast: Secondary | ICD-10-CM

## 2024-02-04 DIAGNOSIS — I119 Hypertensive heart disease without heart failure: Secondary | ICD-10-CM

## 2024-02-20 ENCOUNTER — Other Ambulatory Visit

## 2024-02-21 ENCOUNTER — Ambulatory Visit
Admission: RE | Admit: 2024-02-21 | Discharge: 2024-02-21 | Disposition: A | Discharge status: Home / Self Care | Source: Ambulatory Visit | Attending: Internal Medicine | Admitting: Internal Medicine

## 2024-02-21 DIAGNOSIS — I119 Hypertensive heart disease without heart failure: Secondary | ICD-10-CM

## 2024-03-06 ENCOUNTER — Ambulatory Visit: Admitting: Cardiology

## 2024-03-07 ENCOUNTER — Encounter

## 2024-03-14 ENCOUNTER — Encounter

## 2024-04-04 ENCOUNTER — Ambulatory Visit: Admitting: Cardiology
